# Patient Record
Sex: Male | Born: 1955 | Race: White | Hispanic: No | Marital: Married | Smoking: Former smoker
Health system: Southern US, Community
[De-identification: ages and names within clinical notes are randomized; demographics above are authoritative.]

## PROBLEM LIST (undated history)

## (undated) DIAGNOSIS — R5383 Other fatigue: Secondary | ICD-10-CM

## (undated) DIAGNOSIS — R202 Paresthesia of skin: Secondary | ICD-10-CM

## (undated) DIAGNOSIS — G709 Myoneural disorder, unspecified: Secondary | ICD-10-CM

## (undated) DIAGNOSIS — R7989 Other specified abnormal findings of blood chemistry: Secondary | ICD-10-CM

## (undated) DIAGNOSIS — R200 Anesthesia of skin: Secondary | ICD-10-CM

## (undated) DIAGNOSIS — E785 Hyperlipidemia, unspecified: Secondary | ICD-10-CM

## (undated) DIAGNOSIS — T7840XA Allergy, unspecified, initial encounter: Secondary | ICD-10-CM

## (undated) DIAGNOSIS — G43909 Migraine, unspecified, not intractable, without status migrainosus: Secondary | ICD-10-CM

## (undated) DIAGNOSIS — K219 Gastro-esophageal reflux disease without esophagitis: Secondary | ICD-10-CM

## (undated) DIAGNOSIS — K439 Ventral hernia without obstruction or gangrene: Secondary | ICD-10-CM

## (undated) DIAGNOSIS — K635 Polyp of colon: Secondary | ICD-10-CM

## (undated) DIAGNOSIS — M542 Cervicalgia: Secondary | ICD-10-CM

## (undated) DIAGNOSIS — M1990 Unspecified osteoarthritis, unspecified site: Secondary | ICD-10-CM

## (undated) DIAGNOSIS — M545 Low back pain, unspecified: Secondary | ICD-10-CM

## (undated) DIAGNOSIS — I10 Essential (primary) hypertension: Secondary | ICD-10-CM

## (undated) DIAGNOSIS — R945 Abnormal results of liver function studies: Secondary | ICD-10-CM

## (undated) DIAGNOSIS — M797 Fibromyalgia: Secondary | ICD-10-CM

## (undated) HISTORY — PX: NISSEN FUNDOPLICATION: SHX2091

## (undated) HISTORY — DX: Myoneural disorder, unspecified: G70.9

## (undated) HISTORY — DX: Fibromyalgia: M79.7

## (undated) HISTORY — DX: Essential (primary) hypertension: I10

## (undated) HISTORY — DX: Gastro-esophageal reflux disease without esophagitis: K21.9

## (undated) HISTORY — DX: Unspecified osteoarthritis, unspecified site: M19.90

## (undated) HISTORY — DX: Allergy, unspecified, initial encounter: T78.40XA

## (undated) HISTORY — DX: Cervicalgia: M54.2

## (undated) HISTORY — DX: Hyperlipidemia, unspecified: E78.5

## (undated) HISTORY — PX: UPPER GASTROINTESTINAL ENDOSCOPY: SHX188

## (undated) HISTORY — DX: Abnormal results of liver function studies: R94.5

## (undated) HISTORY — DX: Anesthesia of skin: R20.0

## (undated) HISTORY — PX: STOMACH SURGERY: SHX791

## (undated) HISTORY — DX: Low back pain, unspecified: M54.50

## (undated) HISTORY — DX: Paresthesia of skin: R20.2

## (undated) HISTORY — DX: Ventral hernia without obstruction or gangrene: K43.9

## (undated) HISTORY — PX: POLYPECTOMY: SHX149

## (undated) HISTORY — DX: Migraine, unspecified, not intractable, without status migrainosus: G43.909

## (undated) HISTORY — DX: Other specified abnormal findings of blood chemistry: R79.89

## (undated) HISTORY — PX: COLONOSCOPY: SHX174

## (undated) HISTORY — PX: HAND SURGERY: SHX662

## (undated) HISTORY — DX: Other fatigue: R53.83

## (undated) HISTORY — DX: Polyp of colon: K63.5

## (undated) HISTORY — DX: Low back pain: M54.5

---

## 1992-02-21 HISTORY — PX: HERNIA REPAIR: SHX51

## 2008-07-21 HISTORY — PX: NECK SURGERY: SHX720

## 2008-10-21 HISTORY — PX: OTHER SURGICAL HISTORY: SHX169

## 2012-11-27 ENCOUNTER — Ambulatory Visit (INDEPENDENT_AMBULATORY_CARE_PROVIDER_SITE_OTHER): Payer: BC Managed Care – PPO | Admitting: Family Medicine

## 2012-11-27 VITALS — BP 108/62 | Temp 98.2°F | Ht 65.0 in | Wt 162.1 lb

## 2012-11-27 DIAGNOSIS — G894 Chronic pain syndrome: Secondary | ICD-10-CM

## 2012-11-28 NOTE — Progress Notes (Signed)
Pt here to establish care. Specifically he would like pain medication prescribed.   History, in brief - Saw Dr. Sherryll Burger at [facility] since 03/2012 and was referred to Dr. Weldon Inches for PM. After having a positive UDS for ETOH, pain meds were not prescribed by Dr. Weldon Inches as of 10/2012. Pt upset that Dr. Sherryll Burger would not then start prescribing pain meds. Dr. Sherryll Burger referred to Dr. Gerilyn Pilgrim, who pt is to see this Friday. I spoke with Dr. Sherryll Burger and am told that the pt was angry that Dr. Sherryll Burger would not prescribe the pain meds so he requested his records and has transferred care here.   Pt has a h/o DDD, chronic pain discorder, fibromyalgia, cervicalgia, shoulder pain, leg pain, and a long history of pain management visits in [place].   Pt has most recently been on hydrocodone-apap 10's 4-6 times daily but he says he prefers dilaudid as it works better for him.   The pt and I had a frank discussion regarding his needs. I referred to our controlled substance policy for our office and explained that we do NOT manage chronic pain and will NOT prescribe narcotics or other controlled substances. If he is able to have his pain managed elsewhere, I am more than happy to contoinue to be his pcp and take care of his healthcare needs aside from the chronic pain. Pt very upset by this, raised his voice and became agitated. I was able to calm him down by reminding him that he does have an appt in 2 days with Dr. Gerilyn Pilgrim to discuss pain. The patient declined any PE today, and left, saying that he would see how Friday goes and then decide whether or not to continue seeing [place] for primary care.   Pt did receive flu shot.   No prescriptions given today.

## 2013-01-02 ENCOUNTER — Telehealth: Payer: Self-pay | Admitting: *Deleted

## 2013-01-02 DIAGNOSIS — I10 Essential (primary) hypertension: Secondary | ICD-10-CM

## 2013-01-02 MED ORDER — HYDROCHLOROTHIAZIDE 25 MG PO TABS: 25.0000 mg | ORAL_TABLET | Freq: Every day | ORAL | Status: DC

## 2013-01-02 NOTE — Telephone Encounter (Signed)
When pt and i spoke at our one and only meeting, he was unsure about continuing seeing [place] for primary care. He had come initially with the expectation that we would prescribe pain meds. If he is wanting [place] to be his pcp, that's fine, but he will need to come in for a visit to discuss his medical problems such as htn, will need lab work, and will need a cpe. I will send in 1 month of hctz now, giving him the benefit of the doubt. No further refills on anything until we have seen him however. Please let pt know. Thanks AW.

## 2013-01-02 NOTE — Addendum Note (Signed)
Addended by: Acey Lav on: 01/02/2013 01:26 PM   Modules accepted: Orders

## 2013-01-02 NOTE — Telephone Encounter (Signed)
Pt called and informed nurse that he needed a refill on HCTZ. Nurse noted after chart review that no medications were listed that he was taking, he stated that he was taking HCTZ 25 mg QD, Lisinopril 20 mg QD and Simvastatin but Dr. Judithann Sheen had taken him off Simvastatin. After entering medications he listed in chart I informed him that I would forward to MD for refill approval. He stated he only needed refill on HCTZ that he had plenty left of Lisinopril. Will route to MD.

## 2013-01-02 NOTE — Telephone Encounter (Signed)
Spoke with wife and she was notified. She stated that he would be calling soon for an appointment because he needed a CPE.

## 2013-02-03 ENCOUNTER — Telehealth: Payer: Self-pay | Admitting: *Deleted

## 2013-02-03 NOTE — Telephone Encounter (Signed)
Pt called and left VM rteturning nurse's call. Nurse returned pt call and informed him that when Rx was filled last month he was notified that he needed to come in to see MD before anymore refills would be given but we would supply him with that one month which we did. Pt stated that he does have a f/u scheduled in February. Informed pt that he was informed last month that MD requested him to be seen before any other refills were given. Pt stated "Well I will just keep my appointment in February and go without my blood pressure meds". Nurse informed him that we could give him an appointment sooner and would be able to handle his medication refills at that time but I could not give him any refills at this time but pt declined and stated "It's just gonna be on you all". Attempted to redirect pt and encourage for a sooner appointment but pt declined.

## 2013-02-03 NOTE — Telephone Encounter (Signed)
Pt called and requested refill on HCTZ for 90 day supply. After chart review noted that MD only wanted to refill x 1 until he is seen again for CPE. Nurse attempted to call pt, no answer, message left for callback.

## 2013-02-05 NOTE — Telephone Encounter (Signed)
Very appropriate. Thanks AW.

## 2013-04-08 ENCOUNTER — Encounter: Payer: BC Managed Care – PPO | Admitting: Family Medicine

## 2013-04-14 NOTE — Addendum Note (Signed)
Addended by: Agnes Lawrence on: 04/14/2013 11:50 AM   Modules accepted: Orders

## 2013-04-28 ENCOUNTER — Encounter: Payer: Self-pay | Admitting: Family Medicine

## 2013-04-28 ENCOUNTER — Ambulatory Visit (INDEPENDENT_AMBULATORY_CARE_PROVIDER_SITE_OTHER): Payer: BC Managed Care – PPO | Admitting: Family Medicine

## 2013-04-28 VITALS — BP 130/80 | HR 86 | Temp 98.0°F | Resp 20 | Ht 64.25 in | Wt 168.8 lb

## 2013-04-28 DIAGNOSIS — G894 Chronic pain syndrome: Secondary | ICD-10-CM

## 2013-04-28 NOTE — Progress Notes (Signed)
   Subjective:    Patient ID: Michael Branch, male    DOB: 28-Jul-1955, 58 y.o.   MRN: [id number]  HPI  Pt came to the office today for a CPE. He also c/o cough that has been present for 3 weeks. He takes albuterol and apap. No fevers, GI sx, or shob. He has a h/o smoking but quit 3 mos ago.   His primary concern today is that he feels his chronic pain is not being well managed. He sees Dr. Merlene Laughter for this and is prescribed Dilaudid 2mg  1-2 times daily. He would like an increase in this pain medication. He refuses any drug screens. When he was reminded that we do not manage chronic pain here (that is why he sees Dr. Merlene Laughter) he became beligerent, raised his voice, and was cursing. He was so aggressive that I was concerned for my safety and that of my officemates. I left the room and informed the patient that our visit was over. At the front desk he yelled at the receptionist and cursed at her as well. Pt was informed that he had 5 minutes to vacate the premesis or the police could escort him. He left.   Due to the above, the pt did not have a complete physical exam, although I did hear some mild wheezing on listening to his lungs. I was going to order him an albuterol treatment when he started the argument for narcotics. We did go through a series of questions regarding what blood tests, etc he was due for. As he will no longer be seen in our practice, He should seek a new pcp to handle his primary care.    Review of Systems wheezing, cough     Objective:   Physical Exam  As above      Assessment & Plan:  See above

## 2013-06-10 ENCOUNTER — Encounter (INDEPENDENT_AMBULATORY_CARE_PROVIDER_SITE_OTHER): Payer: Self-pay

## 2013-06-10 ENCOUNTER — Ambulatory Visit (INDEPENDENT_AMBULATORY_CARE_PROVIDER_SITE_OTHER): Payer: Medicare Other | Admitting: General Practice

## 2013-06-10 ENCOUNTER — Encounter: Payer: Self-pay | Admitting: General Practice

## 2013-06-10 VITALS — BP 112/78 | HR 94 | Temp 98.0°F | Ht 65.0 in | Wt 171.4 lb

## 2013-06-10 DIAGNOSIS — M542 Cervicalgia: Secondary | ICD-10-CM

## 2013-06-10 DIAGNOSIS — I10 Essential (primary) hypertension: Secondary | ICD-10-CM

## 2013-06-10 DIAGNOSIS — G8929 Other chronic pain: Secondary | ICD-10-CM

## 2013-06-10 DIAGNOSIS — G4700 Insomnia, unspecified: Secondary | ICD-10-CM

## 2013-06-10 DIAGNOSIS — E785 Hyperlipidemia, unspecified: Secondary | ICD-10-CM

## 2013-06-10 NOTE — Progress Notes (Signed)
   Subjective:    Patient ID: Michael Branch, male    DOB: [birth year], 58 y.o.   MRN: [id number]  HPI Patient presents today to establish care. He reports being seen by Dr.Shaw in [place], [place]. Reports history of hypertension, hyperlipidemia, insomnia, and chronic neck pain. He reports being treated previously by 2 pain management clinics. Was dismissed from one pain management clinic and discontinued going to 2nd because he didn't agree with treatment provided. He reports having a neck injury 12 years ago (currenlty wearing soft neck brace) and pain management began with providers up [place]. He has been in [place] area, for past 3 years. Patient reports pain is best managed with 4 hydrocodone tablets daily and 4mg  diluadid daily. He reports being without pain medications for past 3 months.     Review of Systems  Constitutional: Negative for fever and chills.  Respiratory: Negative for chest tightness and shortness of breath.   Cardiovascular: Negative for chest pain and palpitations.  Gastrointestinal: Negative for nausea, vomiting, abdominal pain, diarrhea, constipation and blood in stool.  Musculoskeletal:       Chronic neck pain   Neurological: Negative for dizziness, weakness and headaches.  Psychiatric/Behavioral: Positive for sleep disturbance. Negative for suicidal ideas and self-injury. The patient is not nervous/anxious.        Objective:   Physical Exam  Constitutional: He is oriented to person, place, and time. He appears well-developed and well-nourished.  HENT:  Head: Normocephalic and atraumatic.  Eyes:  Scarring to left eye (reports dog bite at 59 years old)  Neck: Neck supple. No thyromegaly present.  Cardiovascular: Normal rate, regular rhythm and normal heart sounds.   Pulmonary/Chest: Effort normal and breath sounds normal. No respiratory distress. He exhibits no tenderness.  Abdominal: Soft. Bowel sounds are normal. He exhibits no distension. There is no tenderness.    Musculoskeletal:  Soft cervical collar noted on patient's neck  Lymphadenopathy:    He has no cervical adenopathy.  Neurological: He is alert and oriented to person, place, and time.  Skin: Skin is warm and dry.  Psychiatric: He has a normal mood and affect.          Assessment & Plan:  1. Hypertension   2. Hyperlipidemia   3. Insomnia   4. Chronic neck pain -discussed with patient that pain management would need to be handled by a pain management clinic -patient declined referral to pain management, ortho specialist, or neurology -informed patient that other chronic and acute health conditions could be managed by this office -Patient verbalized understanding and in agreement with other chronic health conditions being managed at [facility] -discussed healthy eating -may seek emergency medical treatment -sign medical release form -RTO in 1 month for follow up labs drawn/meds refilled -Patient verbalized understanding Erby Pian, FNP-C

## 2013-06-11 ENCOUNTER — Other Ambulatory Visit: Payer: Self-pay | Admitting: General Practice

## 2013-06-11 ENCOUNTER — Telehealth: Payer: Self-pay | Admitting: General Practice

## 2013-06-11 DIAGNOSIS — I10 Essential (primary) hypertension: Secondary | ICD-10-CM

## 2013-06-11 DIAGNOSIS — G4700 Insomnia, unspecified: Secondary | ICD-10-CM

## 2013-06-11 MED ORDER — LISINOPRIL 20 MG PO TABS: 20.0000 mg | ORAL_TABLET | Freq: Every day | ORAL | Status: DC

## 2013-06-11 MED ORDER — AMITRIPTYLINE HCL 50 MG PO TABS: 50.0000 mg | ORAL_TABLET | Freq: Every day | ORAL | Status: DC

## 2013-06-11 MED ORDER — HYDROCHLOROTHIAZIDE 25 MG PO TABS: 25.0000 mg | ORAL_TABLET | Freq: Every day | ORAL | Status: DC

## 2013-06-11 NOTE — Telephone Encounter (Signed)
Please inform scripts sent to pharmacy. 30 day supply, patient needs follow up visit and labs before next refill.

## 2013-06-15 DIAGNOSIS — E785 Hyperlipidemia, unspecified: Secondary | ICD-10-CM | POA: Insufficient documentation

## 2013-06-15 DIAGNOSIS — G4700 Insomnia, unspecified: Secondary | ICD-10-CM | POA: Insufficient documentation

## 2013-06-15 DIAGNOSIS — G8929 Other chronic pain: Secondary | ICD-10-CM | POA: Insufficient documentation

## 2013-06-15 DIAGNOSIS — M542 Cervicalgia: Secondary | ICD-10-CM

## 2013-06-15 DIAGNOSIS — I10 Essential (primary) hypertension: Secondary | ICD-10-CM | POA: Insufficient documentation

## 2013-06-15 NOTE — Patient Instructions (Signed)

## 2013-06-26 ENCOUNTER — Telehealth: Payer: Self-pay | Admitting: General Practice

## 2013-06-26 NOTE — Telephone Encounter (Signed)
Patients number is no longer available/ disconnected

## 2013-06-27 ENCOUNTER — Telehealth: Payer: Self-pay | Admitting: General Practice

## 2013-06-27 NOTE — Telephone Encounter (Signed)
appt given for 6/8 with christy

## 2013-07-04 ENCOUNTER — Other Ambulatory Visit: Payer: Self-pay | Admitting: Family

## 2013-07-04 ENCOUNTER — Telehealth: Payer: Self-pay | Admitting: Family

## 2013-07-04 DIAGNOSIS — I10 Essential (primary) hypertension: Secondary | ICD-10-CM

## 2013-07-04 DIAGNOSIS — G4700 Insomnia, unspecified: Secondary | ICD-10-CM

## 2013-07-04 MED ORDER — LISINOPRIL 20 MG PO TABS: 20.0000 mg | ORAL_TABLET | Freq: Every day | ORAL | Status: DC

## 2013-07-04 MED ORDER — HYDROCHLOROTHIAZIDE 25 MG PO TABS: 25.0000 mg | ORAL_TABLET | Freq: Every day | ORAL | Status: DC

## 2013-07-04 MED ORDER — AMITRIPTYLINE HCL 50 MG PO TABS: 50.0000 mg | ORAL_TABLET | Freq: Every day | ORAL | Status: DC

## 2013-07-04 NOTE — Telephone Encounter (Signed)
HCTZ and Lisinopril refilled.  Will you refill Elavil until they can be seen?

## 2013-07-28 ENCOUNTER — Encounter: Payer: Self-pay | Admitting: Family

## 2013-07-28 ENCOUNTER — Ambulatory Visit (INDEPENDENT_AMBULATORY_CARE_PROVIDER_SITE_OTHER): Payer: Medicare Other | Admitting: Family

## 2013-07-28 VITALS — BP 116/77 | HR 109 | Temp 97.0°F | Ht 64.5 in | Wt 170.6 lb

## 2013-07-28 DIAGNOSIS — I10 Essential (primary) hypertension: Secondary | ICD-10-CM

## 2013-07-28 DIAGNOSIS — R5383 Other fatigue: Secondary | ICD-10-CM

## 2013-07-28 DIAGNOSIS — M545 Low back pain, unspecified: Secondary | ICD-10-CM

## 2013-07-28 DIAGNOSIS — M2550 Pain in unspecified joint: Secondary | ICD-10-CM

## 2013-07-28 DIAGNOSIS — R5381 Other malaise: Secondary | ICD-10-CM

## 2013-07-28 DIAGNOSIS — E785 Hyperlipidemia, unspecified: Secondary | ICD-10-CM

## 2013-07-28 DIAGNOSIS — G4700 Insomnia, unspecified: Secondary | ICD-10-CM

## 2013-07-28 DIAGNOSIS — Z1211 Encounter for screening for malignant neoplasm of colon: Secondary | ICD-10-CM

## 2013-07-28 MED ORDER — AMITRIPTYLINE HCL 50 MG PO TABS: 50.0000 mg | ORAL_TABLET | Freq: Every day | ORAL | Status: DC

## 2013-07-28 MED ORDER — HYDROCHLOROTHIAZIDE 25 MG PO TABS: 25.0000 mg | ORAL_TABLET | Freq: Every day | ORAL | Status: DC

## 2013-07-28 MED ORDER — LISINOPRIL 20 MG PO TABS: 20.0000 mg | ORAL_TABLET | Freq: Every day | ORAL | Status: DC

## 2013-07-28 MED ORDER — MELOXICAM 7.5 MG PO TABS: 7.5000 mg | ORAL_TABLET | Freq: Every day | ORAL | Status: DC

## 2013-07-28 NOTE — Progress Notes (Signed)
Subjective:    Patient ID: Michael Branch, male    DOB: [birth year], 58 y.o.   MRN: [id number]  Hyperlipidemia This is a chronic problem. The current episode started more than 1 year ago. The problem is uncontrolled. Recent lipid tests were reviewed and are high. He has no history of diabetes or hypothyroidism. Associated symptoms include myalgias. Pertinent negatives include no leg pain or shortness of breath. Current antihyperlipidemic treatment includes statins. The current treatment provides moderate improvement of lipids. Risk factors for coronary artery disease include dyslipidemia, family history, hypertension and male sex.  Hypertension This is a chronic problem. The current episode started more than 1 year ago. The problem has been resolved since onset. The problem is controlled. Pertinent negatives include no blurred vision, palpitations, peripheral edema or shortness of breath. Risk factors for coronary artery disease include dyslipidemia, family history and male gender. Past treatments include ACE inhibitors and diuretics. The current treatment provides moderate improvement. There is no history of kidney disease, CAD/MI or a thyroid problem.  Insomnia PT currently talking amitriptyline every night at bedtime. States he can not sleep without it. No complaints at this time.    Review of Systems  Constitutional: Positive for fatigue.  HENT: Negative.   Eyes: Negative for blurred vision.  Respiratory: Negative.  Negative for shortness of breath.   Cardiovascular: Negative for palpitations.  Genitourinary: Negative.   Musculoskeletal: Positive for myalgias.  Hematological: Negative.   Psychiatric/Behavioral: Negative.   All other systems reviewed and are negative.      Objective:   Physical Exam  Vitals reviewed. Constitutional: He is oriented to person, place, and time. He appears well-developed and well-nourished. No distress.  HENT:  Head: Normocephalic.  Right Ear:  External ear normal.  Left Ear: External ear normal.  Mouth/Throat: Oropharynx is clear and moist.  Eyes: Pupils are equal, round, and reactive to light. Right eye exhibits no discharge. Left eye exhibits no discharge.  Neck: Normal range of motion. Neck supple. No thyromegaly present.  Cardiovascular: Normal rate, regular rhythm, normal heart sounds and intact distal pulses.   No murmur heard. Pulmonary/Chest: Effort normal and breath sounds normal. No respiratory distress. He has no wheezes.  Abdominal: Soft. Bowel sounds are normal. He exhibits no distension. There is no tenderness.  Musculoskeletal: Normal range of motion. He exhibits no edema and no tenderness.  Neurological: He is alert and oriented to person, place, and time. He has normal reflexes. No cranial nerve deficit.  Skin: Skin is warm and dry. No rash noted. No erythema.  Psychiatric: He has a normal mood and affect. His behavior is normal. Judgment and thought content normal.      BP 116/77  Pulse 109  Temp(Src) 97 F (36.1 C) (Oral)  Ht 5' 4.5" (1.638 m)  Wt 170 lb 9.6 oz (77.384 kg)  BMI 28.84 kg/m2     Assessment & Plan:  1. Hypertension - CMP14+EGFR; Future  2. Hyperlipidemia - Lipid panel; Future  3. Insomnia - amitriptyline (ELAVIL) 50 MG tablet; Take 1 tablet (50 mg total) by mouth at bedtime. Take 1 to 2 tablets at bedtime  Dispense: 180 tablet; Refill: 3  4. Essential hypertension, benign - lisinopril (PRINIVIL,ZESTRIL) 20 MG tablet; Take 1 tablet (20 mg total) by mouth daily.  Dispense: 90 tablet; Refill: 3 - hydrochlorothiazide (HYDRODIURIL) 25 MG tablet; Take 1 tablet (25 mg total) by mouth daily.  Dispense: 90 tablet; Refill: 3  5. Other malaise and fatigue - Anemia Profile B; Future -  Thyroid Panel With TSH; Future - Vit D  25 hydroxy (rtn osteoporosis monitoring); Future  6. Colon cancer screening - Ambulatory referral to Gastroenterology  7. Lower back pain - meloxicam (MOBIC) 7.5 MG  tablet; Take 1 tablet (7.5 mg total) by mouth daily.  Dispense: 30 tablet; Refill: 2  8. Joint pain - meloxicam (MOBIC) 7.5 MG tablet; Take 1 tablet (7.5 mg total) by mouth daily.  Dispense: 30 tablet; Refill: 2   Continue all meds Labs pending Health Maintenance reviewed Diet and exercise encouraged RTO 6 months  Evelina Dun, FNP

## 2013-07-28 NOTE — Patient Instructions (Signed)

## 2013-07-29 ENCOUNTER — Other Ambulatory Visit (INDEPENDENT_AMBULATORY_CARE_PROVIDER_SITE_OTHER): Payer: Medicare Other

## 2013-07-29 DIAGNOSIS — W57XXXA Bitten or stung by nonvenomous insect and other nonvenomous arthropods, initial encounter: Secondary | ICD-10-CM

## 2013-07-29 DIAGNOSIS — E785 Hyperlipidemia, unspecified: Secondary | ICD-10-CM

## 2013-07-29 DIAGNOSIS — R5381 Other malaise: Secondary | ICD-10-CM

## 2013-07-29 DIAGNOSIS — R5383 Other fatigue: Principal | ICD-10-CM

## 2013-07-29 DIAGNOSIS — I10 Essential (primary) hypertension: Secondary | ICD-10-CM

## 2013-07-31 ENCOUNTER — Other Ambulatory Visit: Payer: Self-pay | Admitting: Family

## 2013-07-31 ENCOUNTER — Telehealth: Payer: Self-pay | Admitting: Family Medicine

## 2013-07-31 LAB — LYME AB/WESTERN BLOT REFLEX
LYME DISEASE AB, QUANT, IGM: <0.8 index (ref 0.00–0.79)
Lyme IgG/IgM Ab: <0.91 {ISR} (ref 0.00–0.90)

## 2013-07-31 LAB — ANEMIA PROFILE B
BASOS: 2 %
Basophils Absolute: 0.1 10*3/uL (ref 0.0–0.2)
Eos: 5 %
Eosinophils Absolute: 0.3 10*3/uL (ref 0.0–0.4)
FERRITIN: 429 ng/mL — AB (ref 30–400)
Folate: >19.9 ng/mL (ref >3.0)
HCT: 40.4 % (ref 37.5–51.0)
HEMOGLOBIN: 14.1 g/dL (ref 12.6–17.7)
IRON: 122 ug/dL (ref 40–155)
Immature Grans (Abs): 0 10*3/uL (ref 0.0–0.1)
Immature Granulocytes: 0 %
Iron Saturation: 37 % (ref 15–55)
LYMPHS ABS: 2 10*3/uL (ref 0.7–3.1)
Lymphs: 36 %
MCH: 31.1 pg (ref 26.6–33.0)
MCHC: 34.9 g/dL (ref 31.5–35.7)
MCV: 89 fL (ref 79–97)
MONOS ABS: 0.6 10*3/uL (ref 0.1–0.9)
Monocytes: 11 %
Neutrophils Absolute: 2.6 10*3/uL (ref 1.4–7.0)
Neutrophils Relative %: 46 %
PLATELETS: 271 10*3/uL (ref 150–379)
RBC: 4.53 x10E6/uL (ref 4.14–5.80)
RDW: 14 % (ref 12.3–15.4)
Retic Ct Pct: 1.2 % (ref 0.6–2.6)
TIBC: 328 ug/dL (ref 250–450)
UIBC: 206 ug/dL (ref 150–375)
Vitamin B-12: 524 pg/mL (ref 211–946)
WBC: 5.5 10*3/uL (ref 3.4–10.8)

## 2013-07-31 LAB — CMP14+EGFR
ALBUMIN: 4.8 g/dL (ref 3.5–5.5)
ALK PHOS: 83 IU/L (ref 39–117)
ALT: 107 IU/L — ABNORMAL HIGH (ref 0–44)
AST: 44 IU/L — ABNORMAL HIGH (ref 0–40)
Albumin/Globulin Ratio: 2.3 (ref 1.1–2.5)
BILIRUBIN TOTAL: 0.7 mg/dL (ref 0.0–1.2)
BUN / CREAT RATIO: 14 (ref 9–20)
BUN: 15 mg/dL (ref 6–24)
CO2: 24 mmol/L (ref 18–29)
CREATININE: 1.07 mg/dL (ref 0.76–1.27)
Calcium: 9.8 mg/dL (ref 8.7–10.2)
Chloride: 99 mmol/L (ref 97–108)
GFR calc non Af Amer: 76 mL/min/{1.73_m2} (ref >59)
GFR, EST AFRICAN AMERICAN: 88 mL/min/{1.73_m2} (ref >59)
GLOBULIN, TOTAL: 2.1 g/dL (ref 1.5–4.5)
Glucose: 93 mg/dL (ref 65–99)
Potassium: 5 mmol/L (ref 3.5–5.2)
Sodium: 139 mmol/L (ref 134–144)
Total Protein: 6.9 g/dL (ref 6.0–8.5)

## 2013-07-31 LAB — THYROID PANEL WITH TSH
Free Thyroxine Index: 1.8 (ref 1.2–4.9)
T3 Uptake Ratio: 30 % (ref 24–39)
T4, Total: 6 ug/dL (ref 4.5–12.0)
TSH: 1.84 u[IU]/mL (ref 0.450–4.500)

## 2013-07-31 LAB — LIPID PANEL
Chol/HDL Ratio: 3.2 ratio units (ref 0.0–5.0)
Cholesterol, Total: 214 mg/dL — ABNORMAL HIGH (ref 100–199)
HDL: 66 mg/dL (ref >39)
LDL CALC: 105 mg/dL — AB (ref 0–99)
Triglycerides: 213 mg/dL — ABNORMAL HIGH (ref 0–149)
VLDL Cholesterol Cal: 43 mg/dL — ABNORMAL HIGH (ref 5–40)

## 2013-07-31 LAB — ROCKY MTN SPOTTED FVR ABS PNL(IGG+IGM)
RMSF IGG: NEGATIVE
RMSF IGM: 0.46 {index} (ref 0.00–0.89)

## 2013-07-31 LAB — VITAMIN D 25 HYDROXY (VIT D DEFICIENCY, FRACTURES): Vit D, 25-Hydroxy: 31.4 ng/mL (ref 30.0–100.0)

## 2013-07-31 MED ORDER — ATORVASTATIN CALCIUM 40 MG PO TABS: 40.0000 mg | ORAL_TABLET | Freq: Every day | ORAL | Status: DC

## 2013-07-31 NOTE — Telephone Encounter (Signed)
Message copied by Waverly Ferrari on Thu Jul 31, 2013  3:18 PM ------      Message from: Lenna Gilford, [place] A      Created: Thu Jul 31, 2013 11:16 AM       Anemia Profile (Wbc, Plts, Hgb, Iron, and Vit B12)-WNL      Kidney function stable- Liver levels high- Need to recheck- May need to adjust medication accordingly-Do we have labs from last place he was being seen as a pt?      Cholesterol levels are high-New rx of Liptor 40mg  sent to pharmacy-Stop zocor      Thyroid levels WNL      Vit D levels are on low side of normal-Need to start on Vit D OTC      [place] and [place] Spotted Fever-Negative ------

## 2013-08-05 NOTE — Progress Notes (Signed)
Pt aware of needing to redraw labs next mos. Appt has been made

## 2013-08-19 ENCOUNTER — Encounter: Payer: Self-pay | Admitting: Internal Medicine

## 2013-09-05 ENCOUNTER — Telehealth: Payer: Self-pay | Admitting: Family

## 2013-09-05 ENCOUNTER — Telehealth: Payer: Self-pay | Admitting: Nurse Practitioner

## 2013-09-05 ENCOUNTER — Ambulatory Visit (INDEPENDENT_AMBULATORY_CARE_PROVIDER_SITE_OTHER): Payer: Medicare Other | Admitting: Family

## 2013-09-05 ENCOUNTER — Encounter: Payer: Self-pay | Admitting: Family

## 2013-09-05 VITALS — BP 109/74 | HR 94 | Temp 97.1°F | Ht 64.5 in | Wt 168.8 lb

## 2013-09-05 DIAGNOSIS — R748 Abnormal levels of other serum enzymes: Secondary | ICD-10-CM

## 2013-09-05 DIAGNOSIS — G8929 Other chronic pain: Secondary | ICD-10-CM

## 2013-09-05 DIAGNOSIS — M542 Cervicalgia: Secondary | ICD-10-CM

## 2013-09-05 MED ORDER — TRAMADOL HCL 50 MG PO TABS: 50.0000 mg | ORAL_TABLET | Freq: Three times a day (TID) | ORAL | Status: DC | PRN

## 2013-09-05 MED ORDER — TAPENTADOL HCL 50 MG PO TABS: 50.0000 mg | ORAL_TABLET | Freq: Four times a day (QID) | ORAL | Status: DC | PRN

## 2013-09-05 NOTE — Telephone Encounter (Signed)
Patient gave wrong provider name

## 2013-09-05 NOTE — Progress Notes (Signed)
   Subjective:    Patient ID: Michael Branch, male    DOB: 03-Jun-1955, 58 y.o.   MRN: [id number]  HPI Pt presents to the office to recheck liver enzymes. Pt states he never started his cholesterol medications. Pt states he has been taking 8-10 tylenols a day for the last couple of months since been taking off "pain medication". Pt  States over the last 9 months he drinks 4 beers a night. Pt states he does not have a high fat diet.    Review of Systems  Constitutional: Negative.   HENT: Negative.   Respiratory: Negative.   Cardiovascular: Negative.   Gastrointestinal: Negative.   Endocrine: Negative.   Genitourinary: Negative.   Musculoskeletal: Positive for arthralgias, back pain, joint swelling and neck pain.  Neurological: Negative.   Hematological: Negative.   Psychiatric/Behavioral: Negative.   All other systems reviewed and are negative.      Objective:   Physical Exam  Vitals reviewed. Constitutional: He is oriented to person, place, and time. He appears well-developed and well-nourished. No distress.  Cardiovascular: Normal rate, regular rhythm, normal heart sounds and intact distal pulses.   No murmur heard. Pulmonary/Chest: Effort normal and breath sounds normal. No respiratory distress. He has no wheezes.  Abdominal: Soft. Bowel sounds are normal. He exhibits no distension. There is no tenderness.  Musculoskeletal: Normal range of motion. He exhibits no edema and no tenderness.  Neurological: He is alert and oriented to person, place, and time. He has normal reflexes. No cranial nerve deficit.  Skin: Skin is warm and dry. No rash noted. No erythema.  Psychiatric: He has a normal mood and affect. His behavior is normal. Judgment and thought content normal.    BP 109/74  Pulse 94  Temp(Src) 97.1 F (36.2 C) (Oral)  Ht 5' 4.5" (1.638 m)  Wt 168 lb 12.8 oz (76.567 kg)  BMI 28.54 kg/m2       Assessment & Plan:  1. Elevated liver enzymes -If enzymes come back  elevated will refer to GI dr- Pt would like to wait for referral until labs are redrawn -Pt stop all tylenol and alcohol intake  2. Chronic neck pain - tapentadol (NUCYNTA) 50 MG TABS tablet; Take 1 tablet (50 mg total) by mouth every 6 (six) hours as needed.  Dispense: 60 tablet; Refill: 0   Evelina Dun, FNP

## 2013-09-05 NOTE — Patient Instructions (Signed)
Liver Disease Diet The liver disease diet offers guidelines regarding what foods to eat while affected by a liver disease. The liver is the largest organ in the body and is involved in many important bodily functions. Some of the liver's functions are to remove harmful substances from the bloodstream and to make sure they can exit the body. The liver also produces fluids used by the body and helps use and store energy from food. Liver disease affects the functioning of the liver and the way your body uses energy from foods. An important part of the liver disease diet is to get the right amount of calories from a variety of foods. GUIDELINES Sodium Sodium is a mineral that helps the body change the amount of water and fluids it holds. Too much sodium can cause the body to hold too much fluid. You may need to decrease the amount of sodium in your diet if your body is collecting fluid in your stomach or legs. To decrease the amount of sodium in your diet, do not add extra salt to foods. Also, limit or avoid foods that contain lots of sodium, such as:  Salted snacks (pretzels, potato chips, crackers).  Canned foods (vegetables, soups, juice).  Salted or cured meats and deli meats.  Condiments (ketchup, mustard, soy sauce, barbecue sauce).  Sauerkraut and pickles.  Frozen dinners or processed or preserved foods. A diet of less than 2000 mg of sodium may be recommended. Check food labels for specific levels of sodium.  Alcohol Drinking alcohol may harm your liver. Avoid or limit drinks containing alcohol, such as beer, wine, and hard liquor.  Calories It is important to make sure you are getting enough calories in your diet so that your body gets enough energy and stays at a healthy weight. Include a variety of foods in your diet. Carbohydrates Carbohydrates are found in foods such as breads and starches, grains (oats, flour), cereals, and some vegetables (corn, peas). Carbohydrates change the level  of sugar (glucose) in the blood. Advanced liver disease can affect how much glucose is in the blood, making the glucose level too high or too low. Eating carbohydrates in the right amount will help control your blood glucose. A registered dietician can help you determine how much carbohydrate you need each day. Protein Eating the right amount of protein every day is important for liver disease. Protein is found in foods such as meat, poultry (chicken, [place]), fish, milk, eggs, yogurt, peanuts, peanut butter, and beans. Include a protein-containing food at each meal. Document Released: 02/06/2005 Document Revised: 05/01/2011 Document Reviewed: 12/29/2010 [facility] Patient Information 2015 [place], [place]. This information is not intended to replace advice given to you by your health care provider. Make sure you discuss any questions you have with your health care provider.

## 2013-09-06 LAB — CMP14+EGFR
ALT: 80 IU/L — ABNORMAL HIGH (ref 0–44)
AST: 36 IU/L (ref 0–40)
Albumin/Globulin Ratio: 2.3 (ref 1.1–2.5)
Albumin: 5 g/dL (ref 3.5–5.5)
Alkaline Phosphatase: 81 IU/L (ref 39–117)
BUN/Creatinine Ratio: 14 (ref 9–20)
BUN: 16 mg/dL (ref 6–24)
CALCIUM: 10.2 mg/dL (ref 8.7–10.2)
CO2: 25 mmol/L (ref 18–29)
CREATININE: 1.17 mg/dL (ref 0.76–1.27)
Chloride: 95 mmol/L — ABNORMAL LOW (ref 97–108)
GFR calc Af Amer: 79 mL/min/{1.73_m2} (ref >59)
GFR calc non Af Amer: 68 mL/min/{1.73_m2} (ref >59)
GLOBULIN, TOTAL: 2.2 g/dL (ref 1.5–4.5)
GLUCOSE: 98 mg/dL (ref 65–99)
Potassium: 5.1 mmol/L (ref 3.5–5.2)
SODIUM: 139 mmol/L (ref 134–144)
TOTAL PROTEIN: 7.2 g/dL (ref 6.0–8.5)
Total Bilirubin: 0.6 mg/dL (ref 0.0–1.2)

## 2013-09-10 ENCOUNTER — Telehealth: Payer: Self-pay | Admitting: Family

## 2013-09-10 NOTE — Telephone Encounter (Signed)
Gave to [employer] on 09/10/13

## 2013-10-13 ENCOUNTER — Ambulatory Visit (INDEPENDENT_AMBULATORY_CARE_PROVIDER_SITE_OTHER): Payer: Medicare Other | Admitting: Family

## 2013-10-13 ENCOUNTER — Encounter: Payer: Self-pay | Admitting: Family

## 2013-10-13 VITALS — BP 115/81 | HR 98 | Temp 97.9°F | Ht 64.5 in | Wt 168.2 lb

## 2013-10-13 DIAGNOSIS — Z125 Encounter for screening for malignant neoplasm of prostate: Secondary | ICD-10-CM

## 2013-10-13 DIAGNOSIS — M542 Cervicalgia: Secondary | ICD-10-CM

## 2013-10-13 DIAGNOSIS — G8929 Other chronic pain: Secondary | ICD-10-CM

## 2013-10-13 DIAGNOSIS — G4700 Insomnia, unspecified: Secondary | ICD-10-CM

## 2013-10-13 MED ORDER — TEMAZEPAM 30 MG PO CAPS: 30.0000 mg | ORAL_CAPSULE | Freq: Every evening | ORAL | Status: DC | PRN

## 2013-10-13 MED ORDER — TAPENTADOL HCL 50 MG PO TABS: 50.0000 mg | ORAL_TABLET | Freq: Four times a day (QID) | ORAL | Status: DC | PRN

## 2013-10-13 NOTE — Progress Notes (Signed)
   Subjective:    Patient ID: Michael Branch, male    DOB: 14-Feb-1956, 58 y.o.   MRN: [id number]  HPI Pt presents to the office for follow-up for elevated enzymes. Pt was seen on 07/29/13 with with elevated enzymes and had it redrawn on 09/05/13 with a decrease in his enzymes. Pt states he has eliminated tylenol  and decreased his salt and alcohol  intake.   PT states  He is having muscle pain in his arm, jaws, and legs. Pt states this arm numbness some times wakes him up at night. Pt states that the jaw pain started 2 months. Pt states he is having a hard time sleeping at night even with the amitriptyline.    Review of Systems  Constitutional: Negative.   HENT: Negative.   Respiratory: Negative.   Cardiovascular: Negative.   Gastrointestinal: Negative.   Endocrine: Negative.   Genitourinary: Negative.   Musculoskeletal: Negative.   Neurological: Negative.   Hematological: Negative.   Psychiatric/Behavioral: Negative.   All other systems reviewed and are negative.      Objective:   Physical Exam  Vitals reviewed. Constitutional: He is oriented to person, place, and time. He appears well-developed and well-nourished. No distress.  HENT:  Head: Normocephalic.  Right Ear: External ear normal.  Left Ear: External ear normal.  Nose: Nose normal.  Mouth/Throat: Oropharynx is clear and moist.  Eyes: Pupils are equal, round, and reactive to light. Right eye exhibits no discharge. Left eye exhibits no discharge.  Neck: Normal range of motion. Neck supple. No thyromegaly present.  Cardiovascular: Normal rate, regular rhythm, normal heart sounds and intact distal pulses.   No murmur heard. Pulmonary/Chest: Effort normal and breath sounds normal. No respiratory distress. He has no wheezes.  Abdominal: Soft. Bowel sounds are normal. He exhibits no distension. There is no tenderness.  Musculoskeletal: Normal range of motion. He exhibits no edema and no tenderness.  Neurological: He is  alert and oriented to person, place, and time. He has normal reflexes. No cranial nerve deficit.  Skin: Skin is warm and dry. No rash noted. No erythema.  Psychiatric: He has a normal mood and affect. His behavior is normal. Judgment and thought content normal.   BP 115/81  Pulse 98  Temp(Src) 97.9 F (36.6 C) (Oral)  Ht 5' 4.5" (1.638 m)  Wt 168 lb 3.2 oz (76.295 kg)  BMI 28.44 kg/m2        Assessment & Plan:  1. Insomnia - temazepam (RESTORIL) 30 MG capsule; Take 1 capsule (30 mg total) by mouth at bedtime as needed for sleep.  Dispense: 30 capsule; Refill: 0  2. Chronic neck pain -Spent 41mns or greater discussing pain management options    CMP14+EGFR - tapentadol (NUCYNTA) 50 MG TABS tablet; Take 1 tablet (50 mg total) by mouth every 6 (six) hours as needed.  Dispense: 90 tablet; Refill: 0  3. Prostate cancer screening - PSA, total and free   Continue all meds Labs pending Diet and exercise encouraged RTO 3 months  CEvelina Dun FNP

## 2013-10-13 NOTE — Patient Instructions (Signed)
Chronic Pain Chronic pain can be defined as pain that is off and on and lasts for 3-6 months or longer. Many things cause chronic pain, which can make it difficult to make a diagnosis. There are many treatment options available for chronic pain. However, finding a treatment that works well for you may require trying various approaches until the right one is found. Many people benefit from a combination of two or more types of treatment to control their pain. SYMPTOMS  Chronic pain can occur anywhere in the body and can range from mild to very severe. Some types of chronic pain include:  Headache.  Low back pain.  Cancer pain.  Arthritis pain.  Neurogenic pain. This is pain resulting from damage to nerves. People with chronic pain may also have other symptoms such as:  Depression.  Anger.  Insomnia.  Anxiety. DIAGNOSIS  Your health care provider will help diagnose your condition over time. In many cases, the initial focus will be on excluding possible conditions that could be causing the pain. Depending on your symptoms, your health care provider may order tests to diagnose your condition. Some of these tests may include:   Blood tests.   CT scan.   MRI.   X-rays.   Ultrasounds.   Nerve conduction studies.  You may need to see a specialist.  TREATMENT  Finding treatment that works well may take time. You may be referred to a pain specialist. He or she may prescribe medicine or therapies, such as:   Mindful meditation or yoga.  Shots (injections) of numbing or pain-relieving medicines into the spine or area of pain.  Local electrical stimulation.  Acupuncture.   Massage therapy.   Aroma, color, light, or sound therapy.   Biofeedback.   Working with a physical therapist to keep from getting stiff.   Regular, gentle exercise.   Cognitive or behavioral therapy.   Group support.  Sometimes, surgery may be recommended.  HOME CARE INSTRUCTIONS    Take all medicines as directed by your health care provider.   Lessen stress in your life by relaxing and doing things such as listening to calming music.   Exercise or be active as directed by your health care provider.   Eat a healthy diet and include things such as vegetables, fruits, fish, and lean meats in your diet.   Keep all follow-up appointments with your health care provider.   Attend a support group with others suffering from chronic pain. SEEK MEDICAL CARE IF:   Your pain gets worse.   You develop a new pain that was not there before.   You cannot tolerate medicines given to you by your health care provider.   You have new symptoms since your last visit with your health care provider.  SEEK IMMEDIATE MEDICAL CARE IF:   You feel weak.   You have decreased sensation or numbness.   You lose control of bowel or bladder function.   Your pain suddenly gets much worse.   You develop shaking.  You develop chills.  You develop confusion.  You develop chest pain.  You develop shortness of breath.  MAKE SURE YOU:  Understand these instructions.  Will watch your condition.  Will get help right away if you are not doing well or get worse. Document Released: 10/29/2001 Document Revised: 10/09/2012 Document Reviewed: 08/02/2012 [facility] Patient Information 2015 [place], [place]. This information is not intended to replace advice given to you by your health care provider. Make sure you discuss any  questions you have with your health care provider. Insomnia Insomnia is frequent trouble falling and/or staying asleep. Insomnia can be a long term problem or a short term problem. Both are common. Insomnia can be a short term problem when the wakefulness is related to a certain stress or worry. Long term insomnia is often related to ongoing stress during waking hours and/or poor sleeping habits. Overtime, sleep deprivation itself can make the problem worse.  Every little thing feels more severe because you are overtired and your ability to cope is decreased. CAUSES   Stress, anxiety, and depression.  Poor sleeping habits.  Distractions such as TV in the bedroom.  Naps close to bedtime.  Engaging in emotionally charged conversations before bed.  Technical reading before sleep.  Alcohol and other sedatives. They may make the problem worse. They can hurt normal sleep patterns and normal dream activity.  Stimulants such as caffeine for several hours prior to bedtime.  Pain syndromes and shortness of breath can cause insomnia.  Exercise late at night.  Changing time zones may cause sleeping problems (jet lag). It is sometimes helpful to have someone observe your sleeping patterns. They should look for periods of not breathing during the night (sleep apnea). They should also look to see how long those periods last. If you live alone or observers are uncertain, you can also be observed at a sleep clinic where your sleep patterns will be professionally monitored. Sleep apnea requires a checkup and treatment. Give your caregivers your medical history. Give your caregivers observations your family has made about your sleep.  SYMPTOMS   Not feeling rested in the morning.  Anxiety and restlessness at bedtime.  Difficulty falling and staying asleep. TREATMENT   Your caregiver may prescribe treatment for an underlying medical disorders. Your caregiver can give advice or help if you are using alcohol or other drugs for self-medication. Treatment of underlying problems will usually eliminate insomnia problems.  Medications can be prescribed for short time use. They are generally not recommended for lengthy use.  Over-the-counter sleep medicines are not recommended for lengthy use. They can be habit forming.  You can promote easier sleeping by making lifestyle changes such as:  Using relaxation techniques that help with breathing and reduce  muscle tension.  Exercising earlier in the day.  Changing your diet and the time of your last meal. No night time snacks.  Establish a regular time to go to bed.  Counseling can help with stressful problems and worry.  Soothing music and white noise may be helpful if there are background noises you cannot remove.  Stop tedious detailed work at least one hour before bedtime. HOME CARE INSTRUCTIONS   Keep a diary. Inform your caregiver about your progress. This includes any medication side effects. See your caregiver regularly. Take note of:  Times when you are asleep.  Times when you are awake during the night.  The quality of your sleep.  How you feel the next day. This information will help your caregiver care for you.  Get out of bed if you are still awake after 15 minutes. Read or do some quiet activity. Keep the lights down. Wait until you feel sleepy and go back to bed.  Keep regular sleeping and waking hours. Avoid naps.  Exercise regularly.  Avoid distractions at bedtime. Distractions include watching television or engaging in any intense or detailed activity like attempting to balance the household checkbook.  Develop a bedtime ritual. Keep a familiar routine of bathing,  brushing your teeth, climbing into bed at the same time each night, listening to soothing music. Routines increase the success of falling to sleep faster.  Use relaxation techniques. This can be using breathing and muscle tension release routines. It can also include visualizing peaceful scenes. You can also help control troubling or intruding thoughts by keeping your mind occupied with boring or repetitive thoughts like the old concept of counting sheep. You can make it more creative like imagining planting one beautiful flower after another in your backyard garden.  During your day, work to eliminate stress. When this is not possible use some of the previous suggestions to help reduce the anxiety that  accompanies stressful situations. MAKE SURE YOU:   Understand these instructions.  Will watch your condition.  Will get help right away if you are not doing well or get worse. Document Released: 02/04/2000 Document Revised: 05/01/2011 Document Reviewed: 03/06/2007 [facility] Patient Information 2015 [place], [place]. This information is not intended to replace advice given to you by your health care provider. Make sure you discuss any questions you have with your health care provider.

## 2013-10-14 LAB — PSA, TOTAL AND FREE
PSA FREE PCT: 8.3 %
PSA, Free: 0.15 ng/mL
PSA: 1.8 ng/mL (ref 0.0–4.0)

## 2013-10-14 LAB — CMP14+EGFR
ALT: 62 IU/L — AB (ref 0–44)
AST: 29 IU/L (ref 0–40)
Albumin/Globulin Ratio: 2.6 — ABNORMAL HIGH (ref 1.1–2.5)
Albumin: 5 g/dL (ref 3.5–5.5)
Alkaline Phosphatase: 78 IU/L (ref 39–117)
BILIRUBIN TOTAL: 0.5 mg/dL (ref 0.0–1.2)
BUN/Creatinine Ratio: 13 (ref 9–20)
BUN: 16 mg/dL (ref 6–24)
CO2: 23 mmol/L (ref 18–29)
Calcium: 9.9 mg/dL (ref 8.7–10.2)
Chloride: 97 mmol/L (ref 97–108)
Creatinine, Ser: 1.28 mg/dL — ABNORMAL HIGH (ref 0.76–1.27)
GFR calc Af Amer: 71 mL/min/{1.73_m2} (ref >59)
GFR calc non Af Amer: 61 mL/min/{1.73_m2} (ref >59)
Globulin, Total: 1.9 g/dL (ref 1.5–4.5)
Glucose: 102 mg/dL — ABNORMAL HIGH (ref 65–99)
Potassium: 5.1 mmol/L (ref 3.5–5.2)
Sodium: 137 mmol/L (ref 134–144)
TOTAL PROTEIN: 6.9 g/dL (ref 6.0–8.5)

## 2013-10-15 ENCOUNTER — Encounter: Payer: Self-pay | Admitting: Internal Medicine

## 2013-10-16 ENCOUNTER — Telehealth: Payer: Self-pay | Admitting: Family

## 2013-10-16 ENCOUNTER — Telehealth: Payer: Self-pay | Admitting: *Deleted

## 2013-10-16 NOTE — Telephone Encounter (Signed)
Message copied by Cline Crock on Thu Oct 16, 2013  3:22 PM ------      Message from: [place], [place] A      Created: Wed Oct 15, 2013  2:32 PM       Kidney and liver function stable- Liver enzymes continue to decrease       ------

## 2013-10-16 NOTE — Telephone Encounter (Signed)
Discussed results with patient

## 2013-10-16 NOTE — Telephone Encounter (Signed)
Patient has noticed persistent nausea since discontinuing Elavil.  He is taking Zantac 150mg  daily and Tums PRN. This as well as eating helps short term. He questioned whether we thought this would improve over the next few days or if there is something that can be done. He also asked it he could take Zantac more than once a day. Advised that he can take 150mg  BID if necessary.

## 2013-10-20 ENCOUNTER — Other Ambulatory Visit: Payer: Self-pay | Admitting: Family Medicine

## 2013-10-20 MED ORDER — ONDANSETRON HCL 4 MG PO TABS: 4.0000 mg | ORAL_TABLET | Freq: Three times a day (TID) | ORAL | Status: DC | PRN

## 2013-10-20 NOTE — Telephone Encounter (Signed)
rx called in

## 2013-10-20 NOTE — Telephone Encounter (Signed)
Zofran sent to pharmacy for nausea. Pt can take 1-2 tablets every 8 hours until nausea improves. The nausea should only be temporary.

## 2013-10-21 ENCOUNTER — Telehealth: Payer: Self-pay | Admitting: Family

## 2013-10-22 ENCOUNTER — Ambulatory Visit: Payer: Self-pay | Admitting: Family

## 2013-10-23 MED ORDER — AMITRIPTYLINE HCL 50 MG PO TABS: 50.0000 mg | ORAL_TABLET | Freq: Every day | ORAL | Status: DC

## 2013-10-23 NOTE — Telephone Encounter (Signed)
RX sent to pharmacy  

## 2013-11-06 ENCOUNTER — Telehealth: Payer: Self-pay | Admitting: Family

## 2013-11-06 DIAGNOSIS — M542 Cervicalgia: Principal | ICD-10-CM

## 2013-11-06 DIAGNOSIS — G8929 Other chronic pain: Secondary | ICD-10-CM

## 2013-11-07 MED ORDER — AMITRIPTYLINE HCL 50 MG PO TABS: 50.0000 mg | ORAL_TABLET | Freq: Every day | ORAL | Status: DC

## 2013-11-07 MED ORDER — TAPENTADOL HCL 50 MG PO TABS: 50.0000 mg | ORAL_TABLET | Freq: Four times a day (QID) | ORAL | Status: DC | PRN

## 2013-11-07 NOTE — Telephone Encounter (Signed)
Left Elavil refill on pharmacy voicemail. Patient aware to pickup Nucynta.

## 2013-11-11 ENCOUNTER — Ambulatory Visit (INDEPENDENT_AMBULATORY_CARE_PROVIDER_SITE_OTHER): Payer: Medicare Other

## 2013-11-11 ENCOUNTER — Encounter: Payer: Self-pay | Admitting: Family Medicine

## 2013-11-11 ENCOUNTER — Ambulatory Visit (INDEPENDENT_AMBULATORY_CARE_PROVIDER_SITE_OTHER): Payer: Medicare Other | Admitting: Family Medicine

## 2013-11-11 VITALS — BP 116/71 | HR 97 | Temp 97.5°F | Ht 64.5 in | Wt 167.0 lb

## 2013-11-11 DIAGNOSIS — R059 Cough, unspecified: Secondary | ICD-10-CM

## 2013-11-11 DIAGNOSIS — J209 Acute bronchitis, unspecified: Secondary | ICD-10-CM

## 2013-11-11 DIAGNOSIS — R05 Cough: Secondary | ICD-10-CM

## 2013-11-11 DIAGNOSIS — J208 Acute bronchitis due to other specified organisms: Secondary | ICD-10-CM

## 2013-11-11 MED ORDER — AMLODIPINE BESYLATE 5 MG PO TABS: 5.0000 mg | ORAL_TABLET | Freq: Every day | ORAL | Status: DC

## 2013-11-11 MED ORDER — AMOXICILLIN 875 MG PO TABS: 875.0000 mg | ORAL_TABLET | Freq: Two times a day (BID) | ORAL | Status: DC

## 2013-11-11 MED ORDER — LEVALBUTEROL HCL 1.25 MG/0.5ML IN NEBU: 1.2500 mg | INHALATION_SOLUTION | Freq: Once | RESPIRATORY_TRACT | Status: DC

## 2013-11-11 MED ORDER — ALBUTEROL SULFATE HFA 108 (90 BASE) MCG/ACT IN AERS: 2.0000 | INHALATION_SPRAY | Freq: Four times a day (QID) | RESPIRATORY_TRACT | Status: DC | PRN

## 2013-11-11 MED ORDER — LEVALBUTEROL HCL 1.25 MG/3ML IN NEBU
1.2500 mg | INHALATION_SOLUTION | Freq: Once | RESPIRATORY_TRACT | Status: AC
  Administered 2013-11-11: 1.25 mg via RESPIRATORY_TRACT

## 2013-11-11 MED ORDER — METHYLPREDNISOLONE ACETATE 80 MG/ML IJ SUSP
120.0000 mg | Freq: Once | INTRAMUSCULAR | Status: AC
  Administered 2013-11-11: 120 mg via INTRAMUSCULAR

## 2013-11-11 MED ORDER — HYDROCODONE-HOMATROPINE 5-1.5 MG/5ML PO SYRP: 5.0000 mL | ORAL_SOLUTION | Freq: Three times a day (TID) | ORAL | Status: DC | PRN

## 2013-11-11 NOTE — Progress Notes (Signed)
   Subjective:    Patient ID: Michael Branch, male    DOB: 1955/05/17, 58 y.o.   MRN: [id number]  HPI C/o cough and uri sx's for over a week.  He is having severe and persistent cough which is causing him to have headache.  He is on ACE and c/o chronic dry hacky cough.    Review of Systems No chest pain, SOB, HA, dizziness, vision change, N/V, diarrhea, constipation, dysuria, urinary urgency or frequency, myalgias, arthralgias or rash.     Objective:   Physical Exam  Vital signs noted  Well developed well nourished male.  HEENT - Head atraumatic Normocephalic                Eyes - PERRLA, Conjuctiva - clear Sclera- Clear EOMI                Ears - EAC's Wnl TM's Wnl Gross Hearing WNL                Nose - Nares patent                 Throat - oropharanx wnl Respiratory - Lungs with exp wheezes bilateral Cardiac - RRR S1 and S2 without murmur GI - Abdomen soft Nontender and bowel sounds active x 4 Extremities - No edema. Neuro - Grossly intact.  cxr - no infiltrates Prelimnary reading by Iverson Alamin    Assessment & Plan:  Cough - Plan: DG Chest 2 View, amLODipine (NORVASC) 5 MG tablet, methylPREDNISolone acetate (DEPO-MEDROL) injection 120 mg, levalbuterol (XOPENEX) nebulizer solution 1.25 mg, HYDROcodone-homatropine (HYCODAN) 5-1.5 MG/5ML syrup, amoxicillin (AMOXIL) 875 MG tablet  Acute bronchitis due to other specified organisms - Plan: amLODipine (NORVASC) 5 MG tablet, methylPREDNISolone acetate (DEPO-MEDROL) injection 120 mg, levalbuterol (XOPENEX) nebulizer solution 1.25 mg, HYDROcodone-homatropine (HYCODAN) 5-1.5 MG/5ML syrup, amoxicillin (AMOXIL) 875 MG tablet  HTN - DC lisinopril and start amlodipine.  Lysbeth Penner FNP

## 2013-11-17 ENCOUNTER — Other Ambulatory Visit: Payer: Self-pay | Admitting: Family Medicine

## 2013-11-17 ENCOUNTER — Telehealth: Payer: Self-pay | Admitting: Family Medicine

## 2013-11-17 NOTE — Telephone Encounter (Signed)
Please follow up

## 2013-11-18 NOTE — Telephone Encounter (Signed)
Also complains of an increase in dizziness. Appt scheduled for tomorrow at 11:00 with Dietrich Pates, FNP to reevaluate and to address dizziness.  Patient has experienced dizziness in the past but it improved once he was placed on Lisinopril. It has returned since discontinuing Lisinopril.  He also questioned his prescription for amitriptyline. It was written for #30 and to take one daily. He normally receives #60 and instructions are 1-2 daily. He has been trying to cut back. Asked him to discuss with [facility] tomorrow.

## 2013-11-19 ENCOUNTER — Encounter: Payer: Self-pay | Admitting: Family Medicine

## 2013-11-19 ENCOUNTER — Ambulatory Visit (INDEPENDENT_AMBULATORY_CARE_PROVIDER_SITE_OTHER): Payer: Medicare Other | Admitting: Family Medicine

## 2013-11-19 VITALS — BP 111/79 | HR 102 | Temp 97.1°F | Ht 64.5 in | Wt 166.0 lb

## 2013-11-19 DIAGNOSIS — L219 Seborrheic dermatitis, unspecified: Secondary | ICD-10-CM

## 2013-11-19 DIAGNOSIS — M545 Low back pain, unspecified: Secondary | ICD-10-CM

## 2013-11-19 MED ORDER — SELENIUM SULFIDE 2.5 % EX LOTN: 1.0000 "application " | TOPICAL_LOTION | Freq: Every day | CUTANEOUS | Status: DC | PRN

## 2013-11-19 MED ORDER — HYDROCODONE-ACETAMINOPHEN 5-325 MG PO TABS: 1.0000 | ORAL_TABLET | Freq: Four times a day (QID) | ORAL | Status: DC | PRN

## 2013-11-19 MED ORDER — METHYLPREDNISOLONE (PAK) 4 MG PO TABS: ORAL_TABLET | ORAL | Status: DC

## 2013-11-20 NOTE — Progress Notes (Signed)
   Subjective:    Patient ID: Michael Branch, male    DOB: [birth year], 58 y.o.   MRN: [id number]  HPI  C/o low back pain and dry rash or seborrhea on scalp  Review of Systems C/o LBP   No chest pain, SOB, HA, dizziness, vision change, N/V, diarrhea, constipation, dysuria, urinary urgency or frequency or rash.   Objective:   Physical Exam    Vital signs noted  Well developed well nourished male.  HEENT - Head atraumatic Normocephalic Respiratory - Lungs CTA bilateral Cardiac - RRR S1 and S2 without murmur MS - TTP LS spine  Assessment & Plan:  Seborrhea - Plan: selenium sulfide (SELSUN) 2.5 % shampoo  Right-sided low back pain without sciatica - Plan: methylPREDNIsolone (MEDROL DOSPACK) 4 MG tablet, HYDROcodone-acetaminophen (NORCO) 5-325 MG per tablet  Lysbeth Penner FNP

## 2013-12-08 ENCOUNTER — Ambulatory Visit (INDEPENDENT_AMBULATORY_CARE_PROVIDER_SITE_OTHER): Payer: Medicare Other | Admitting: Family Medicine

## 2013-12-08 ENCOUNTER — Encounter: Payer: Self-pay | Admitting: Family Medicine

## 2013-12-08 ENCOUNTER — Encounter (INDEPENDENT_AMBULATORY_CARE_PROVIDER_SITE_OTHER): Payer: Self-pay

## 2013-12-08 VITALS — BP 111/82 | HR 114 | Temp 95.9°F | Ht 64.5 in | Wt 165.5 lb

## 2013-12-08 DIAGNOSIS — J209 Acute bronchitis, unspecified: Secondary | ICD-10-CM

## 2013-12-08 DIAGNOSIS — G8929 Other chronic pain: Secondary | ICD-10-CM

## 2013-12-08 DIAGNOSIS — M542 Cervicalgia: Secondary | ICD-10-CM

## 2013-12-08 MED ORDER — SODIUM CHLORIDE 0.9 % IV SOLN: 125.0000 mg | Freq: Once | INTRAVENOUS | Status: DC

## 2013-12-08 MED ORDER — HYDROCODONE-HOMATROPINE 5-1.5 MG/5ML PO SYRP: 5.0000 mL | ORAL_SOLUTION | Freq: Three times a day (TID) | ORAL | Status: DC | PRN

## 2013-12-08 MED ORDER — LEVOFLOXACIN 500 MG PO TABS: 500.0000 mg | ORAL_TABLET | Freq: Every day | ORAL | Status: DC

## 2013-12-08 MED ORDER — LEVALBUTEROL HCL 1.25 MG/3ML IN NEBU
1.2500 mg | INHALATION_SOLUTION | Freq: Once | RESPIRATORY_TRACT | Status: AC
  Administered 2013-12-08: 1.25 mg via RESPIRATORY_TRACT

## 2013-12-08 MED ORDER — LEVALBUTEROL HCL 1.25 MG/3ML IN NEBU: 1.2500 mg | INHALATION_SOLUTION | RESPIRATORY_TRACT | Status: DC | PRN

## 2013-12-08 MED ORDER — LEVALBUTEROL HCL 1.25 MG/0.5ML IN NEBU: 1.2500 mg | INHALATION_SOLUTION | Freq: Once | RESPIRATORY_TRACT | Status: DC

## 2013-12-08 MED ORDER — PREDNISONE 10 MG PO TABS: ORAL_TABLET | ORAL | Status: DC

## 2013-12-08 MED ORDER — TAPENTADOL HCL 50 MG PO TABS: 50.0000 mg | ORAL_TABLET | Freq: Four times a day (QID) | ORAL | Status: DC | PRN

## 2013-12-08 MED ORDER — METHYLPREDNISOLONE SODIUM SUCC 125 MG IJ SOLR
125.0000 mg | Freq: Once | INTRAMUSCULAR | Status: AC
  Administered 2013-12-08: 125 mg via INTRAMUSCULAR

## 2013-12-08 NOTE — Progress Notes (Signed)
   Subjective:    Patient ID: Michael Branch, male    DOB: Jun 23, 1955, 58 y.o.   MRN: [id number]  HPI C/o cough and bronchitis sx's.  He has not been using his SABA MDI.  He was better but then started to flare a few days ago and now he has severe persistent cough.  He needs refills on his pain medicine.   Review of Systems    No chest pain, SOB, HA, dizziness, vision change, N/V, diarrhea, constipation, dysuria, urinary urgency or frequency, myalgias, arthralgias or rash.  Objective:   Physical Exam  Vital signs noted  Well developed well nourished male.  HEENT - Head atraumatic Normocephalic                Eyes - PERRLA, Conjuctiva - clear Sclera- Clear EOMI                Ears - EAC's Wnl TM's Wnl Gross Hearing WNL                Nose - Nares patent                 Throat - oropharanx wnl Respiratory - Lungs CTA bilateral Cardiac - RRR S1 and S2 without murmur GI - Abdomen soft Nontender and bowel sounds active x 4 Extremities - No edema. Neuro - Grossly intact.      Assessment & Plan:  Acute bronchitis, unspecified organism - Plan: levofloxacin (LEVAQUIN) 500 MG tablet, predniSONE (DELTASONE) 10 MG tablet, levalbuterol (XOPENEX) nebulizer solution 1.25 mg, methylPREDNISolone sodium succinate (SOLU-MEDROL) 130 mg in sodium chloride 0.9 % 50 mL IVPB, HYDROcodone-homatropine (HYCODAN) 5-1.5 MG/5ML syrup, DISCONTINUED: HYDROcodone-homatropine (HYCODAN) 5-1.5 MG/5ML syrup  Chronic neck pain - Plan: tapentadol (NUCYNTA) 50 MG TABS tablet  Lysbeth Penner FNP

## 2013-12-08 NOTE — Addendum Note (Signed)
Addended by: Marin Olp on: 12/08/2013 06:18 PM   Modules accepted: Orders

## 2013-12-23 ENCOUNTER — Ambulatory Visit (AMBULATORY_SURGERY_CENTER): Payer: No Typology Code available for payment source

## 2013-12-23 VITALS — Ht 65.0 in | Wt 165.6 lb

## 2013-12-23 DIAGNOSIS — Z800 Family history of malignant neoplasm of digestive organs: Secondary | ICD-10-CM

## 2013-12-23 DIAGNOSIS — Z86010 Personal history of colonic polyps: Secondary | ICD-10-CM

## 2013-12-23 MED ORDER — MOVIPREP 100 G PO SOLR: ORAL | Status: DC

## 2013-12-23 NOTE — Progress Notes (Signed)
Per pt, no allergies to soy or egg products.Pt not taking any weight loss meds or using  O2 at home.    Per pt,some sedation cause elevated B/P and dizziness!

## 2013-12-24 ENCOUNTER — Telehealth: Payer: Self-pay | Admitting: Family

## 2013-12-24 ENCOUNTER — Other Ambulatory Visit: Payer: Self-pay | Admitting: Family Medicine

## 2013-12-24 MED ORDER — AMITRIPTYLINE HCL 50 MG PO TABS: ORAL_TABLET | ORAL | Status: DC

## 2014-01-01 ENCOUNTER — Telehealth: Payer: Self-pay | Admitting: Family Medicine

## 2014-01-01 NOTE — Telephone Encounter (Signed)
Appt given for tomorrow per patient request

## 2014-01-02 ENCOUNTER — Encounter: Payer: Self-pay | Admitting: Family Medicine

## 2014-01-02 ENCOUNTER — Ambulatory Visit (INDEPENDENT_AMBULATORY_CARE_PROVIDER_SITE_OTHER): Payer: Medicare Other | Admitting: Family Medicine

## 2014-01-02 VITALS — BP 105/72 | HR 111 | Temp 97.1°F | Ht 64.5 in | Wt 165.0 lb

## 2014-01-02 DIAGNOSIS — J209 Acute bronchitis, unspecified: Secondary | ICD-10-CM

## 2014-01-02 DIAGNOSIS — R05 Cough: Secondary | ICD-10-CM

## 2014-01-02 DIAGNOSIS — G8929 Other chronic pain: Secondary | ICD-10-CM

## 2014-01-02 DIAGNOSIS — M542 Cervicalgia: Secondary | ICD-10-CM

## 2014-01-02 DIAGNOSIS — R059 Cough, unspecified: Secondary | ICD-10-CM

## 2014-01-02 DIAGNOSIS — J208 Acute bronchitis due to other specified organisms: Secondary | ICD-10-CM

## 2014-01-02 DIAGNOSIS — R0602 Shortness of breath: Secondary | ICD-10-CM

## 2014-01-02 MED ORDER — PREDNISONE 10 MG PO TABS: ORAL_TABLET | ORAL | Status: DC

## 2014-01-02 MED ORDER — ALBUTEROL SULFATE HFA 108 (90 BASE) MCG/ACT IN AERS: 2.0000 | INHALATION_SPRAY | Freq: Four times a day (QID) | RESPIRATORY_TRACT | Status: DC | PRN

## 2014-01-02 MED ORDER — AMITRIPTYLINE HCL 75 MG PO TABS: 75.0000 mg | ORAL_TABLET | Freq: Every day | ORAL | Status: DC

## 2014-01-02 MED ORDER — HYDROCODONE-ACETAMINOPHEN 5-325 MG PO TABS: 1.0000 | ORAL_TABLET | Freq: Four times a day (QID) | ORAL | Status: DC | PRN

## 2014-01-02 MED ORDER — LEVOFLOXACIN 500 MG PO TABS: 500.0000 mg | ORAL_TABLET | Freq: Every day | ORAL | Status: DC

## 2014-01-02 NOTE — Progress Notes (Signed)
   Subjective:    Patient ID: Michael Branch, male    DOB: 07/18/1955, 58 y.o.   MRN: [id number]  HPI C/o persistent cough and wheezing.  He has SOB.  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  He was taking Nucynta but cannot afford it anymore.  He has been to see 2 pain doctors and has had problems according to patient.  He is in severe pain and is nucynta is running out and he cannot afford it and wants to try another.  Review of Systems  Constitutional: Negative for fever.  HENT: Negative for ear pain.   Eyes: Negative for discharge.  Respiratory: Negative for cough.   Cardiovascular: Negative for chest pain.  Gastrointestinal: Negative for abdominal distention.  Endocrine: Negative for polyuria.  Genitourinary: Negative for difficulty urinating.  Musculoskeletal: Negative for gait problem and neck pain.  Skin: Negative for color change and rash.  Neurological: Negative for speech difficulty and headaches.  Psychiatric/Behavioral: Negative for agitation.       Objective:    BP 105/72 mmHg  Pulse 111  Temp(Src) 97.1 F (36.2 C) (Oral)  Ht 5' 4.5" (1.638 m)  Wt 165 lb (74.844 kg)  BMI 27.90 kg/m2 Physical Exam  Constitutional: He is oriented to person, place, and time. He appears well-developed and well-nourished.  HENT:  Head: Normocephalic and atraumatic.  Mouth/Throat: Oropharynx is clear and moist.  Eyes: Pupils are equal, round, and reactive to light.  Neck: Normal range of motion. Neck supple.  Cardiovascular: Normal rate and regular rhythm.   No murmur heard. Pulmonary/Chest: Effort normal. He has wheezes.  Abdominal: Soft. Bowel sounds are normal. There is no tenderness.  Neurological: He is alert and oriented to person, place, and time.  Skin: Skin is warm and dry.  Psychiatric: He has a normal mood and affect.          Assessment & Plan:     ICD-9-CM ICD-10-CM   1. Cough 786.2 R05 albuterol (PROVENTIL  HFA;VENTOLIN HFA) 108 (90 BASE) MCG/ACT inhaler  2. Acute bronchitis due to other specified organisms 466.0 J20.8 albuterol (PROVENTIL HFA;VENTOLIN HFA) 108 (90 BASE) MCG/ACT inhaler  3. Acute bronchitis, unspecified organism 466.0 J20.9 levofloxacin (LEVAQUIN) 500 MG tablet     predniSONE (DELTASONE) 10 MG tablet  4. SOB (shortness of breath) 786.05 R06.02 Ambulatory referral to Pulmonology  5. Chronic neck pain 723.1 M54.2 HYDROcodone-acetaminophen (NORCO) 5-325 MG per tablet   338.29 G89.29 amitriptyline (ELAVIL) 75 MG tablet   Explained to patient will need to eventually get into pain management.  No Follow-up on file.  Lysbeth Penner FNP

## 2014-01-06 ENCOUNTER — Encounter: Payer: Self-pay | Admitting: Internal Medicine

## 2014-01-14 ENCOUNTER — Ambulatory Visit: Payer: Medicare Other | Admitting: Family Medicine

## 2014-01-26 ENCOUNTER — Telehealth: Payer: Self-pay | Admitting: Family

## 2014-01-26 DIAGNOSIS — G8929 Other chronic pain: Secondary | ICD-10-CM

## 2014-01-26 DIAGNOSIS — M542 Cervicalgia: Principal | ICD-10-CM

## 2014-01-29 NOTE — Telephone Encounter (Signed)
Last seen and filled 01/02/14. Rx will print

## 2014-01-30 MED ORDER — HYDROCODONE-ACETAMINOPHEN 5-325 MG PO TABS: 1.0000 | ORAL_TABLET | Freq: Four times a day (QID) | ORAL | Status: DC | PRN

## 2014-01-30 NOTE — Telephone Encounter (Signed)
Pt calling to see if presc. Ready.   No presc. Up front.  Call pt [phone]

## 2014-01-30 NOTE — Telephone Encounter (Signed)
Rx upfront and pt aware

## 2014-02-23 ENCOUNTER — Other Ambulatory Visit: Payer: Self-pay | Admitting: Family

## 2014-02-23 ENCOUNTER — Telehealth: Payer: Self-pay | Admitting: *Deleted

## 2014-02-23 DIAGNOSIS — M542 Cervicalgia: Principal | ICD-10-CM

## 2014-02-23 DIAGNOSIS — G8929 Other chronic pain: Secondary | ICD-10-CM

## 2014-02-23 MED ORDER — HYDROCODONE-ACETAMINOPHEN 5-325 MG PO TABS: 1.0000 | ORAL_TABLET | Freq: Four times a day (QID) | ORAL | Status: DC | PRN

## 2014-02-23 NOTE — Telephone Encounter (Signed)
Aware ,script ready. 

## 2014-02-23 NOTE — Telephone Encounter (Signed)
Last seen 01/02/14, last filled 01/30/14. Rx will print

## 2014-03-18 ENCOUNTER — Other Ambulatory Visit: Payer: Self-pay | Admitting: Family

## 2014-03-18 DIAGNOSIS — G894 Chronic pain syndrome: Secondary | ICD-10-CM

## 2014-03-19 NOTE — Telephone Encounter (Signed)
Pt notified too early for refill Also informed of referral to pain management per [facility] understanding

## 2014-04-17 ENCOUNTER — Other Ambulatory Visit: Payer: Self-pay | Admitting: Family

## 2014-04-17 ENCOUNTER — Encounter: Payer: Self-pay | Admitting: Family

## 2014-04-17 ENCOUNTER — Ambulatory Visit (INDEPENDENT_AMBULATORY_CARE_PROVIDER_SITE_OTHER): Payer: Medicare Other | Admitting: Family

## 2014-04-17 ENCOUNTER — Encounter (INDEPENDENT_AMBULATORY_CARE_PROVIDER_SITE_OTHER): Payer: Self-pay

## 2014-04-17 VITALS — BP 131/87 | HR 78 | Temp 96.8°F | Ht 64.5 in | Wt 173.0 lb

## 2014-04-17 DIAGNOSIS — M542 Cervicalgia: Secondary | ICD-10-CM

## 2014-04-17 DIAGNOSIS — G8929 Other chronic pain: Secondary | ICD-10-CM | POA: Diagnosis not present

## 2014-04-17 DIAGNOSIS — E785 Hyperlipidemia, unspecified: Secondary | ICD-10-CM | POA: Diagnosis not present

## 2014-04-17 DIAGNOSIS — G4700 Insomnia, unspecified: Secondary | ICD-10-CM | POA: Diagnosis not present

## 2014-04-17 DIAGNOSIS — I10 Essential (primary) hypertension: Secondary | ICD-10-CM | POA: Diagnosis not present

## 2014-04-17 DIAGNOSIS — Z1321 Encounter for screening for nutritional disorder: Secondary | ICD-10-CM

## 2014-04-17 MED ORDER — CYCLOBENZAPRINE HCL 10 MG PO TABS: 10.0000 mg | ORAL_TABLET | Freq: Three times a day (TID) | ORAL | Status: DC | PRN

## 2014-04-17 MED ORDER — HYDROCODONE-ACETAMINOPHEN 5-325 MG PO TABS: 1.0000 | ORAL_TABLET | Freq: Four times a day (QID) | ORAL | Status: DC | PRN

## 2014-04-17 NOTE — Patient Instructions (Signed)

## 2014-04-17 NOTE — Progress Notes (Signed)
Subjective:    Patient ID: Michael Branch, male    DOB: [birth year], 59 y.o.   MRN: [id number]  Hyperlipidemia This is a chronic problem. The current episode started more than 1 year ago. The problem is controlled. Recent lipid tests were reviewed and are normal. He has no history of diabetes. Pertinent negatives include no leg pain or shortness of breath. Current antihyperlipidemic treatment includes diet change. The current treatment provides mild improvement of lipids. Risk factors for coronary artery disease include dyslipidemia, hypertension, male sex, obesity and a sedentary lifestyle.  Hypertension This is a chronic problem. The current episode started more than 1 year ago. The problem has been resolved since onset. The problem is controlled. Associated symptoms include headaches. Pertinent negatives include no anxiety, malaise/fatigue, palpitations, peripheral edema or shortness of breath. Risk factors for coronary artery disease include dyslipidemia and male gender. Past treatments include calcium channel blockers and diuretics. The current treatment provides mild improvement. There is no history of kidney disease, CAD/MI, CVA, heart failure or a thyroid problem. There is no history of sleep apnea.  Insomnia Pt currently taking amitriptyline 75 mcg at bedtime every night. PT states it helps a lot. No complaints at this time.     Review of Systems  Constitutional: Negative.  Negative for malaise/fatigue.  Respiratory: Negative.  Negative for shortness of breath.   Cardiovascular: Negative.  Negative for palpitations.  Gastrointestinal: Negative.   Endocrine: Negative.   Genitourinary: Negative.   Musculoskeletal: Negative.   Neurological: Positive for headaches.  Hematological: Negative.   Psychiatric/Behavioral: Negative.   All other systems reviewed and are negative.      Objective:   Physical Exam  Constitutional: He is oriented to person, place, and time. He appears  well-developed and well-nourished. No distress.  HENT:  Head: Normocephalic.  Right Ear: External ear normal.  Left Ear: External ear normal.  Nose: Nose normal.  Mouth/Throat: Oropharynx is clear and moist.  Eyes: Pupils are equal, round, and reactive to light. Right eye exhibits no discharge. Left eye exhibits no discharge.  Neck: Normal range of motion. Neck supple. No thyromegaly present.  Cardiovascular: Normal rate, regular rhythm, normal heart sounds and intact distal pulses.   No murmur heard. Pulmonary/Chest: Effort normal and breath sounds normal. No respiratory distress. He has no wheezes.  Abdominal: Soft. Bowel sounds are normal. He exhibits no distension. There is no tenderness.  Musculoskeletal: Normal range of motion. He exhibits no edema or tenderness.  Neurological: He is alert and oriented to person, place, and time. He has normal reflexes. No cranial nerve deficit.  Skin: Skin is warm and dry. No rash noted. No erythema.  Psychiatric: He has a normal mood and affect. His behavior is normal. Judgment and thought content normal.  Vitals reviewed.   BP 131/87 mmHg  Pulse 78  Temp(Src) 96.8 F (36 C) (Oral)  Ht 5' 4.5" (1.638 m)  Wt 173 lb (78.472 kg)  BMI 29.25 kg/m2       Assessment & Plan:  1. Essential hypertension - CMP14+EGFR  2. Insomnia - CMP14+EGFR  3. Hyperlipidemia - CMP14+EGFR - Lipid panel  4. Chronic neck pain - CMP14+EGFR - HYDROcodone-acetaminophen (NORCO) 5-325 MG per tablet; Take 1-2 tablets by mouth every 6 (six) hours as needed for moderate pain.  Dispense: 90 tablet; Refill: 0  5. Encounter for vitamin deficiency screening - Vit D  25 hydroxy (rtn osteoporosis monitoring)   Continue all meds Labs pending Health Maintenance reviewed Diet and exercise encouraged RTO  6 months   Evelina Dun, FNP

## 2014-04-18 LAB — LIPID PANEL
Chol/HDL Ratio: 4.3 ratio units (ref 0.0–5.0)
Cholesterol, Total: 303 mg/dL — ABNORMAL HIGH (ref 100–199)
HDL: 71 mg/dL (ref >39)
LDL Calculated: 170 mg/dL — ABNORMAL HIGH (ref 0–99)
TRIGLYCERIDES: 309 mg/dL — AB (ref 0–149)
VLDL Cholesterol Cal: 62 mg/dL — ABNORMAL HIGH (ref 5–40)

## 2014-04-18 LAB — CMP14+EGFR
ALT: 52 IU/L — ABNORMAL HIGH (ref 0–44)
AST: 29 IU/L (ref 0–40)
Albumin/Globulin Ratio: 2.2 (ref 1.1–2.5)
Albumin: 4.8 g/dL (ref 3.5–5.5)
Alkaline Phosphatase: 78 IU/L (ref 39–117)
BILIRUBIN TOTAL: 0.5 mg/dL (ref 0.0–1.2)
BUN/Creatinine Ratio: 12 (ref 9–20)
BUN: 13 mg/dL (ref 6–24)
CHLORIDE: 98 mmol/L (ref 97–108)
CO2: 30 mmol/L — ABNORMAL HIGH (ref 18–29)
CREATININE: 1.12 mg/dL (ref 0.76–1.27)
Calcium: 10.3 mg/dL — ABNORMAL HIGH (ref 8.7–10.2)
GFR calc Af Amer: 83 mL/min/{1.73_m2} (ref >59)
GFR, EST NON AFRICAN AMERICAN: 72 mL/min/{1.73_m2} (ref >59)
Globulin, Total: 2.2 g/dL (ref 1.5–4.5)
Glucose: 87 mg/dL (ref 65–99)
Potassium: 4.5 mmol/L (ref 3.5–5.2)
Sodium: 140 mmol/L (ref 134–144)
TOTAL PROTEIN: 7 g/dL (ref 6.0–8.5)

## 2014-04-18 LAB — VITAMIN D 25 HYDROXY (VIT D DEFICIENCY, FRACTURES): Vit D, 25-Hydroxy: 25.5 ng/mL — ABNORMAL LOW (ref 30.0–100.0)

## 2014-04-20 ENCOUNTER — Other Ambulatory Visit: Payer: Self-pay | Admitting: Family

## 2014-04-20 DIAGNOSIS — E559 Vitamin D deficiency, unspecified: Secondary | ICD-10-CM | POA: Insufficient documentation

## 2014-04-20 MED ORDER — PRAVASTATIN SODIUM 20 MG PO TABS: 20.0000 mg | ORAL_TABLET | Freq: Every day | ORAL | Status: DC

## 2014-04-20 MED ORDER — VITAMIN D (ERGOCALCIFEROL) 1.25 MG (50000 UNIT) PO CAPS: 50000.0000 [IU] | ORAL_CAPSULE | ORAL | Status: DC

## 2014-05-12 ENCOUNTER — Other Ambulatory Visit: Payer: Self-pay | Admitting: Family

## 2014-05-12 DIAGNOSIS — M542 Cervicalgia: Principal | ICD-10-CM

## 2014-05-12 DIAGNOSIS — G8929 Other chronic pain: Secondary | ICD-10-CM

## 2014-05-12 MED ORDER — HYDROCODONE-ACETAMINOPHEN 5-325 MG PO TABS: 1.0000 | ORAL_TABLET | Freq: Four times a day (QID) | ORAL | Status: DC | PRN

## 2014-05-12 NOTE — Telephone Encounter (Signed)
RX ready for pick up 

## 2014-05-12 NOTE — Telephone Encounter (Signed)
Detailed message left for patient that rx is ready to be picked up 

## 2014-06-10 ENCOUNTER — Telehealth: Payer: Self-pay | Admitting: Family

## 2014-06-10 DIAGNOSIS — G8929 Other chronic pain: Secondary | ICD-10-CM

## 2014-06-10 DIAGNOSIS — M542 Cervicalgia: Principal | ICD-10-CM

## 2014-06-10 MED ORDER — HYDROCODONE-ACETAMINOPHEN 5-325 MG PO TABS: 1.0000 | ORAL_TABLET | Freq: Four times a day (QID) | ORAL | Status: DC | PRN

## 2014-06-10 NOTE — Telephone Encounter (Signed)
RX ready for pick up 

## 2014-06-10 NOTE — Telephone Encounter (Signed)
lmovm that Rx was at front office ready to be picked up

## 2014-07-09 ENCOUNTER — Telehealth: Payer: Self-pay | Admitting: Family

## 2014-07-09 DIAGNOSIS — M542 Cervicalgia: Principal | ICD-10-CM

## 2014-07-09 DIAGNOSIS — G8929 Other chronic pain: Secondary | ICD-10-CM

## 2014-07-09 NOTE — Telephone Encounter (Signed)
Please review and advise.

## 2014-07-10 MED ORDER — HYDROCODONE-ACETAMINOPHEN 5-325 MG PO TABS: 1.0000 | ORAL_TABLET | Freq: Four times a day (QID) | ORAL | Status: DC | PRN

## 2014-07-10 NOTE — Telephone Encounter (Signed)
lmovm that written Rx at front desk ready for pickup

## 2014-07-10 NOTE — Telephone Encounter (Signed)
RX ready for pick up 

## 2014-07-27 ENCOUNTER — Other Ambulatory Visit: Payer: Self-pay | Admitting: Family

## 2014-08-10 ENCOUNTER — Telehealth: Payer: Self-pay | Admitting: Family

## 2014-08-10 DIAGNOSIS — G8929 Other chronic pain: Secondary | ICD-10-CM

## 2014-08-10 DIAGNOSIS — M542 Cervicalgia: Principal | ICD-10-CM

## 2014-08-10 MED ORDER — HYDROCODONE-ACETAMINOPHEN 5-325 MG PO TABS: 1.0000 | ORAL_TABLET | Freq: Four times a day (QID) | ORAL | Status: DC | PRN

## 2014-08-10 NOTE — Telephone Encounter (Signed)
RX ready for pick up 

## 2014-08-10 NOTE — Telephone Encounter (Signed)
Spoke with pt- notified that prescription is ready to be picked up.

## 2014-09-09 ENCOUNTER — Telehealth: Payer: Self-pay | Admitting: Family

## 2014-09-09 DIAGNOSIS — M542 Cervicalgia: Principal | ICD-10-CM

## 2014-09-09 DIAGNOSIS — G8929 Other chronic pain: Secondary | ICD-10-CM

## 2014-09-09 MED ORDER — HYDROCODONE-ACETAMINOPHEN 5-325 MG PO TABS: 1.0000 | ORAL_TABLET | Freq: Four times a day (QID) | ORAL | Status: DC | PRN

## 2014-09-09 NOTE — Telephone Encounter (Signed)
Detailed message left that rx is ready to be picked up.  

## 2014-09-11 DIAGNOSIS — H52221 Regular astigmatism, right eye: Secondary | ICD-10-CM | POA: Diagnosis not present

## 2014-09-11 DIAGNOSIS — H524 Presbyopia: Secondary | ICD-10-CM | POA: Diagnosis not present

## 2014-09-11 DIAGNOSIS — H5203 Hypermetropia, bilateral: Secondary | ICD-10-CM | POA: Diagnosis not present

## 2014-09-11 DIAGNOSIS — H5702 Anisocoria: Secondary | ICD-10-CM | POA: Diagnosis not present

## 2014-10-08 ENCOUNTER — Telehealth: Payer: Self-pay | Admitting: Family

## 2014-10-08 DIAGNOSIS — G8929 Other chronic pain: Secondary | ICD-10-CM

## 2014-10-08 DIAGNOSIS — M542 Cervicalgia: Principal | ICD-10-CM

## 2014-10-08 MED ORDER — HYDROCODONE-ACETAMINOPHEN 5-325 MG PO TABS: 1.0000 | ORAL_TABLET | Freq: Four times a day (QID) | ORAL | Status: DC | PRN

## 2014-10-08 NOTE — Telephone Encounter (Signed)
Patient aware that script is available for pickup at front desk

## 2014-10-08 NOTE — Telephone Encounter (Signed)
RX ready for pick up 

## 2014-10-16 ENCOUNTER — Encounter (INDEPENDENT_AMBULATORY_CARE_PROVIDER_SITE_OTHER): Payer: Self-pay

## 2014-10-16 ENCOUNTER — Ambulatory Visit (INDEPENDENT_AMBULATORY_CARE_PROVIDER_SITE_OTHER): Payer: Medicare Other | Admitting: Family

## 2014-10-16 ENCOUNTER — Encounter: Payer: Self-pay | Admitting: Family

## 2014-10-16 VITALS — BP 113/81 | HR 81 | Temp 97.2°F | Ht 64.5 in | Wt 174.0 lb

## 2014-10-16 DIAGNOSIS — Z1159 Encounter for screening for other viral diseases: Secondary | ICD-10-CM | POA: Diagnosis not present

## 2014-10-16 DIAGNOSIS — J208 Acute bronchitis due to other specified organisms: Secondary | ICD-10-CM | POA: Diagnosis not present

## 2014-10-16 DIAGNOSIS — R05 Cough: Secondary | ICD-10-CM

## 2014-10-16 DIAGNOSIS — G8929 Other chronic pain: Secondary | ICD-10-CM | POA: Diagnosis not present

## 2014-10-16 DIAGNOSIS — E559 Vitamin D deficiency, unspecified: Secondary | ICD-10-CM

## 2014-10-16 DIAGNOSIS — I10 Essential (primary) hypertension: Secondary | ICD-10-CM

## 2014-10-16 DIAGNOSIS — M542 Cervicalgia: Secondary | ICD-10-CM | POA: Diagnosis not present

## 2014-10-16 DIAGNOSIS — E785 Hyperlipidemia, unspecified: Secondary | ICD-10-CM | POA: Diagnosis not present

## 2014-10-16 DIAGNOSIS — G4700 Insomnia, unspecified: Secondary | ICD-10-CM | POA: Diagnosis not present

## 2014-10-16 DIAGNOSIS — R5383 Other fatigue: Secondary | ICD-10-CM

## 2014-10-16 DIAGNOSIS — R059 Cough, unspecified: Secondary | ICD-10-CM

## 2014-10-16 MED ORDER — HYDROCODONE-ACETAMINOPHEN 7.5-325 MG PO TABS: 1.0000 | ORAL_TABLET | Freq: Four times a day (QID) | ORAL | Status: DC | PRN

## 2014-10-16 MED ORDER — AMLODIPINE BESYLATE 5 MG PO TABS: 5.0000 mg | ORAL_TABLET | Freq: Every day | ORAL | Status: DC

## 2014-10-16 MED ORDER — HYDROCHLOROTHIAZIDE 25 MG PO TABS: ORAL_TABLET | ORAL | Status: DC

## 2014-10-16 NOTE — Progress Notes (Signed)
Subjective:    Patient ID: Michael Branch, male    DOB: [birth year], 59 y.o.   MRN: [id number]  Pt presents to the office today for chronic follow up. Pt states he feels fatigue and having pain in his legs. Pt states he had neck surgery in 2002 and states he has nerve damage. Pt states he has seen a rheumatologists, neurologists, and pain clinic in the past with no relief.  Hypertension This is a chronic problem. The current episode started more than 1 year ago. The problem has been resolved since onset. The problem is controlled. Associated symptoms include headaches. Pertinent negatives include no anxiety, malaise/fatigue, palpitations, peripheral edema or shortness of breath. Risk factors for coronary artery disease include dyslipidemia and male gender. Past treatments include calcium channel blockers and diuretics. The current treatment provides mild improvement. There is no history of kidney disease, CAD/MI, CVA, heart failure or a thyroid problem. There is no history of sleep apnea.  Hyperlipidemia This is a chronic problem. The current episode started more than 1 year ago. The problem is uncontrolled. Recent lipid tests were reviewed and are high. He has no history of diabetes. Pertinent negatives include no leg pain or shortness of breath. Current antihyperlipidemic treatment includes diet change and statins. The current treatment provides mild improvement of lipids. Risk factors for coronary artery disease include dyslipidemia, hypertension, male sex, obesity and a sedentary lifestyle.  Insomnia Pt currently taking amitriptyline 75 mcg at bedtime every night. PT states it helps a lot. No complaints at this time.     Review of Systems  Constitutional: Positive for fatigue. Negative for malaise/fatigue.  Respiratory: Negative.  Negative for shortness of breath.   Cardiovascular: Negative.  Negative for palpitations.  Gastrointestinal: Negative.   Endocrine: Negative.   Genitourinary:  Negative.   Musculoskeletal: Negative.   Neurological: Positive for headaches.  Hematological: Negative.   Psychiatric/Behavioral: Negative.   All other systems reviewed and are negative.      Objective:   Physical Exam  Constitutional: He is oriented to person, place, and time. He appears well-developed and well-nourished. No distress.  HENT:  Head: Normocephalic.  Right Ear: External ear normal.  Left Ear: External ear normal.  Nose: Nose normal.  Mouth/Throat: Oropharynx is clear and moist.  Eyes: Pupils are equal, round, and reactive to light. Right eye exhibits no discharge. Left eye exhibits no discharge.  Neck: Normal range of motion. Neck supple. No thyromegaly present.  Cardiovascular: Normal rate, regular rhythm, normal heart sounds and intact distal pulses.   No murmur heard. Pulmonary/Chest: Effort normal and breath sounds normal. No respiratory distress. He has no wheezes.  Abdominal: Soft. Bowel sounds are normal. He exhibits no distension. There is no tenderness.  Musculoskeletal: Normal range of motion. He exhibits no edema or tenderness.  Neurological: He is alert and oriented to person, place, and time. He has normal reflexes. No cranial nerve deficit.  Skin: Skin is warm and dry. No rash noted. No erythema.  Psychiatric: He has a normal mood and affect. His behavior is normal. Judgment and thought content normal.  Vitals reviewed.     BP 113/81 mmHg  Pulse 81  Temp(Src) 97.2 F (36.2 C) (Oral)  Ht 5' 4.5" (1.638 m)  Wt 174 lb (78.926 kg)  BMI 29.42 kg/m2     Assessment & Plan:  1. Vitamin D deficiency - CMP14+EGFR - Vit D  25 hydroxy (rtn osteoporosis monitoring)  2. Chronic neck pain -Pt to take daily stool softener  -  CMP14+EGFR - Arthritis Panel - HYDROcodone-acetaminophen (NORCO) 7.5-325 MG per tablet; Take 1 tablet by mouth every 6 (six) hours as needed for moderate pain.  Dispense: 90 tablet; Refill: 0  3. Insomnia - CMP14+EGFR  4.  Hyperlipidemia - CMP14+EGFR - Lipid panel  5. Essential hypertension - CMP14+EGFR  6. Other fatigue - Anemia Profile B - CMP14+EGFR - Thyroid Panel With TSH - Arthritis Panel  7. Need for hepatitis C screening test - CMP14+EGFR - Hepatitis C antibody   Continue all meds Labs pending Health Maintenance reviewed Diet and exercise encouraged RTO 3 months for chronic followup  Evelina Dun, FNP

## 2014-10-16 NOTE — Patient Instructions (Addendum)
Health Maintenance A healthy lifestyle and preventative care can promote health and wellness.  Maintain regular health, dental, and eye exams.  Eat a healthy diet. Foods like vegetables, fruits, whole grains, low-fat dairy products, and lean protein foods contain the nutrients you need and are low in calories. Decrease your intake of foods high in solid fats, added sugars, and salt. Get information about a proper diet from your health care provider, if necessary.  Regular physical exercise is one of the most important things you can do for your health. Most adults should get at least 150 minutes of moderate-intensity exercise (any activity that increases your heart rate and causes you to sweat) each week. In addition, most adults need muscle-strengthening exercises on 2 or more days a week.   Maintain a healthy weight. The body mass index (BMI) is a screening tool to identify possible weight problems. It provides an estimate of body fat based on height and weight. Your health care provider can find your BMI and can help you achieve or maintain a healthy weight. For males 20 years and older:  A BMI below 18.5 is considered underweight.  A BMI of 18.5 to 24.9 is normal.  A BMI of 25 to 29.9 is considered overweight.  A BMI of 30 and above is considered obese.  Maintain normal blood lipids and cholesterol by exercising and minimizing your intake of saturated fat. Eat a balanced diet with plenty of fruits and vegetables. Blood tests for lipids and cholesterol should begin at age 20 and be repeated every 5 years. If your lipid or cholesterol levels are high, you are over age 50, or you are at high risk for heart disease, you may need your cholesterol levels checked more frequently.Ongoing high lipid and cholesterol levels should be treated with medicines if diet and exercise are not working.  If you smoke, find out from your health care provider how to quit. If you do not use tobacco, do not  start.  Lung cancer screening is recommended for adults aged 55-80 years who are at high risk for developing lung cancer because of a history of smoking. A yearly low-dose CT scan of the lungs is recommended for people who have at least a 30-pack-year history of smoking and are current smokers or have quit within the past 15 years. A pack year of smoking is smoking an average of 1 pack of cigarettes a day for 1 year (for example, a 30-pack-year history of smoking could mean smoking 1 pack a day for 30 years or 2 packs a day for 15 years). Yearly screening should continue until the smoker has stopped smoking for at least 15 years. Yearly screening should be stopped for people who develop a health problem that would prevent them from having lung cancer treatment.  If you choose to drink alcohol, do not have more than 2 drinks per day. One drink is considered to be 12 oz (360 mL) of beer, 5 oz (150 mL) of wine, or 1.5 oz (45 mL) of liquor.  Avoid the use of street drugs. Do not share needles with anyone. Ask for help if you need support or instructions about stopping the use of drugs.  High blood pressure causes heart disease and increases the risk of stroke. Blood pressure should be checked at least every 1-2 years. Ongoing high blood pressure should be treated with medicines if weight loss and exercise are not effective.  If you are 45-79 years old, ask your health care provider if   you should take aspirin to prevent heart disease.  Diabetes screening involves taking a blood sample to check your fasting blood sugar level. This should be done once every 3 years after age 45 if you are at a normal weight and without risk factors for diabetes. Testing should be considered at a younger age or be carried out more frequently if you are overweight and have at least 1 risk factor for diabetes.  Colorectal cancer can be detected and often prevented. Most routine colorectal cancer screening begins at the age of 50  and continues through age 75. However, your health care provider may recommend screening at an earlier age if you have risk factors for colon cancer. On a yearly basis, your health care provider may provide home test kits to check for hidden blood in the stool. A small camera at the end of a tube may be used to directly examine the colon (sigmoidoscopy or colonoscopy) to detect the earliest forms of colorectal cancer. Talk to your health care provider about this at age 50 when routine screening begins. A direct exam of the colon should be repeated every 5-10 years through age 75, unless early forms of precancerous polyps or small growths are found.  People who are at an increased risk for hepatitis B should be screened for this virus. You are considered at high risk for hepatitis B if:  You were born in a country where hepatitis B occurs often. Talk with your health care provider about which countries are considered high risk.  Your parents were born in a high-risk country and you have not received a shot to protect against hepatitis B (hepatitis B vaccine).  You have HIV or AIDS.  You use needles to inject street drugs.  You live with, or have sex with, someone who has hepatitis B.  You are a man who has sex with other men (MSM).  You get hemodialysis treatment.  You take certain medicines for conditions like cancer, organ transplantation, and autoimmune conditions.  Hepatitis C blood testing is recommended for all people born from 1945 through 1965 and any individual with known risk factors for hepatitis C.  Healthy men should no longer receive prostate-specific antigen (PSA) blood tests as part of routine cancer screening. Talk to your health care provider about prostate cancer screening.  Testicular cancer screening is not recommended for adolescents or adult males who have no symptoms. Screening includes self-exam, a health care provider exam, and other screening tests. Consult with your  health care provider about any symptoms you have or any concerns you have about testicular cancer.  Practice safe sex. Use condoms and avoid high-risk sexual practices to reduce the spread of sexually transmitted infections (STIs).  You should be screened for STIs, including gonorrhea and chlamydia if:  You are sexually active and are younger than 24 years.  You are older than 24 years, and your health care provider tells you that you are at risk for this type of infection.  Your sexual activity has changed since you were last screened, and you are at an increased risk for chlamydia or gonorrhea. Ask your health care provider if you are at risk.  If you are at risk of being infected with HIV, it is recommended that you take a prescription medicine daily to prevent HIV infection. This is called pre-exposure prophylaxis (PrEP). You are considered at risk if:  You are a man who has sex with other men (MSM).  You are a heterosexual man who   is sexually active with multiple partners.  You take drugs by injection.  You are sexually active with a partner who has HIV.  Talk with your health care provider about whether you are at high risk of being infected with HIV. If you choose to begin PrEP, you should first be tested for HIV. You should then be tested every 3 months for as long as you are taking PrEP.  Use sunscreen. Apply sunscreen liberally and repeatedly throughout the day. You should seek shade when your shadow is shorter than you. Protect yourself by wearing long sleeves, pants, a wide-brimmed hat, and sunglasses year round whenever you are outdoors.  Tell your health care provider of new moles or changes in moles, especially if there is a change in shape or color. Also, tell your health care provider if a mole is larger than the size of a pencil eraser.  A one-time screening for abdominal aortic aneurysm (AAA) and surgical repair of large AAAs by ultrasound is recommended for men aged  47-75 years who are current or former smokers.  Stay current with your vaccines (immunizations). Document Released: 08/05/2007 Document Revised: 02/11/2013 Document Reviewed: 07/04/2010 [facility] Patient Information 2015 [place], [place]. This information is not intended to replace advice given to you by Fatigue Fatigue is a feeling of tiredness, lack of energy, lack of motivation, or feeling tired all the time. Having enough rest, good nutrition, and reducing stress will normally reduce fatigue. Consult your caregiver if it persists. The nature of your fatigue will help your caregiver to find out its cause. The treatment is based on the cause.  CAUSES  There are many causes for fatigue. Most of the time, fatigue can be traced to one or more of your habits or routines. Most causes fit into one or more of three general areas. They are: Lifestyle problems Sleep disturbances. Overwork. Physical exertion. Unhealthy habits. Poor eating habits or eating disorders. Alcohol and/or drug use . Lack of proper nutrition (malnutrition). Psychological problems Stress and/or anxiety problems. Depression. Grief. Boredom. Medical Problems or Conditions Anemia. Pregnancy. Thyroid gland problems. Recovery from major surgery. Continuous pain. Emphysema or asthma that is not well controlled Allergic conditions. Diabetes. Infections (such as mononucleosis). Obesity. Sleep disorders, such as sleep apnea. Heart failure or other heart-related problems. Cancer. Kidney disease. Liver disease. Effects of certain medicines such as antihistamines, cough and cold remedies, prescription pain medicines, heart and blood pressure medicines, drugs used for treatment of cancer, and some antidepressants. SYMPTOMS  The symptoms of fatigue include:  Lack of energy. Lack of drive (motivation). Drowsiness. Feeling of indifference to the surroundings. DIAGNOSIS  The details of how you feel help guide your caregiver  in finding out what is causing the fatigue. You will be asked about your present and past health condition. It is important to review all medicines that you take, including prescription and non-prescription items. A thorough exam will be done. You will be questioned about your feelings, habits, and normal lifestyle. Your caregiver may suggest blood tests, urine tests, or other tests to look for common medical causes of fatigue.  TREATMENT  Fatigue is treated by correcting the underlying cause. For example, if you have continuous pain or depression, treating these causes will improve how you feel. Similarly, adjusting the dose of certain medicines will help in reducing fatigue.  HOME CARE INSTRUCTIONS  Try to get the required amount of good sleep every night. Eat a healthy and nutritious diet, and drink enough water throughout the day. Practice ways  of relaxing (including yoga or meditation). Exercise regularly. Make plans to change situations that cause stress. Act on those plans so that stresses decrease over time. Keep your work and personal routine reasonable. Avoid street drugs and minimize use of alcohol. Start taking a daily multivitamin after consulting your caregiver. SEEK MEDICAL CARE IF:  You have persistent tiredness, which cannot be accounted for. You have fever. You have unintentional weight loss. You have headaches. You have disturbed sleep throughout the night. You are feeling sad. You have constipation. You have dry skin. You have gained weight. You are taking any new or different medicines that you suspect are causing fatigue. You are unable to sleep at night. You develop any unusual swelling of your legs or other parts of your body. SEEK IMMEDIATE MEDICAL CARE IF:  You are feeling confused. Your vision is blurred. You feel faint or pass out. You develop severe headache. You develop severe abdominal, pelvic, or back pain. You develop chest pain, shortness of breath, or  an irregular or fast heartbeat. You are unable to pass a normal amount of urine. You develop abnormal bleeding such as bleeding from the rectum or you vomit blood. You have thoughts about harming yourself or committing suicide. You are worried that you might harm someone else. MAKE SURE YOU:  Understand these instructions. Will watch your condition. Will get help right away if you are not doing well or get worse. Document Released: 12/04/2006 Document Revised: 05/01/2011 Document Reviewed: 06/10/2013 [facility] Patient Information 2015 [place], [place]. This information is not intended to replace advice given to you by your health care provider. Make sure you discuss any questions you have with your health care provider. your health care provider. Make sure you discuss any questions you have with your health care provider.

## 2014-10-17 LAB — ANEMIA PROFILE B
Basophils Absolute: 0.1 10*3/uL (ref 0.0–0.2)
Basos: 1 %
EOS (ABSOLUTE): 0.4 10*3/uL (ref 0.0–0.4)
EOS: 6 %
Ferritin: 262 ng/mL (ref 30–400)
Folate: >20 ng/mL (ref >3.0)
Hematocrit: 42.7 % (ref 37.5–51.0)
Hemoglobin: 15.1 g/dL (ref 12.6–17.7)
IMMATURE GRANS (ABS): 0 10*3/uL (ref 0.0–0.1)
IMMATURE GRANULOCYTES: 0 %
IRON SATURATION: 44 % (ref 15–55)
Iron: 136 ug/dL (ref 38–169)
Lymphocytes Absolute: 2.1 10*3/uL (ref 0.7–3.1)
Lymphs: 33 %
MCH: 30.3 pg (ref 26.6–33.0)
MCHC: 35.4 g/dL (ref 31.5–35.7)
MCV: 86 fL (ref 79–97)
MONOS ABS: 0.6 10*3/uL (ref 0.1–0.9)
Monocytes: 10 %
Neutrophils Absolute: 3.1 10*3/uL (ref 1.4–7.0)
Neutrophils: 50 %
Platelets: 235 10*3/uL (ref 150–379)
RBC: 4.98 x10E6/uL (ref 4.14–5.80)
RDW: 14.3 % (ref 12.3–15.4)
Retic Ct Pct: 1.2 % (ref 0.6–2.6)
Total Iron Binding Capacity: 311 ug/dL (ref 250–450)
UIBC: 175 ug/dL (ref 111–343)
Vitamin B-12: 636 pg/mL (ref 211–946)
WBC: 6.2 10*3/uL (ref 3.4–10.8)

## 2014-10-17 LAB — CMP14+EGFR
ALT: 91 IU/L — AB (ref 0–44)
AST: 43 IU/L — ABNORMAL HIGH (ref 0–40)
Albumin/Globulin Ratio: 2.3 (ref 1.1–2.5)
Albumin: 4.8 g/dL (ref 3.5–5.5)
Alkaline Phosphatase: 80 IU/L (ref 39–117)
BUN/Creatinine Ratio: 15 (ref 9–20)
BUN: 15 mg/dL (ref 6–24)
Bilirubin Total: 0.9 mg/dL (ref 0.0–1.2)
CALCIUM: 9.5 mg/dL (ref 8.7–10.2)
CO2: 26 mmol/L (ref 18–29)
CREATININE: 1 mg/dL (ref 0.76–1.27)
Chloride: 97 mmol/L (ref 97–108)
GFR calc Af Amer: 95 mL/min/{1.73_m2} (ref >59)
GFR calc non Af Amer: 82 mL/min/{1.73_m2} (ref >59)
Globulin, Total: 2.1 g/dL (ref 1.5–4.5)
Glucose: 97 mg/dL (ref 65–99)
Potassium: 3.8 mmol/L (ref 3.5–5.2)
Sodium: 140 mmol/L (ref 134–144)
Total Protein: 6.9 g/dL (ref 6.0–8.5)

## 2014-10-17 LAB — HEPATITIS C ANTIBODY: Hep C Virus Ab: <0.1 s/co ratio (ref 0.0–0.9)

## 2014-10-17 LAB — ARTHRITIS PANEL
SED RATE: 6 mm/h (ref 0–30)
Uric Acid: 6.9 mg/dL (ref 3.7–8.6)

## 2014-10-17 LAB — LIPID PANEL
CHOL/HDL RATIO: 3.6 ratio (ref 0.0–5.0)
Cholesterol, Total: 224 mg/dL — ABNORMAL HIGH (ref 100–199)
HDL: 63 mg/dL (ref >39)
LDL CALC: 114 mg/dL — AB (ref 0–99)
TRIGLYCERIDES: 235 mg/dL — AB (ref 0–149)
VLDL Cholesterol Cal: 47 mg/dL — ABNORMAL HIGH (ref 5–40)

## 2014-10-17 LAB — THYROID PANEL WITH TSH
FREE THYROXINE INDEX: 1.7 (ref 1.2–4.9)
T3 Uptake Ratio: 27 % (ref 24–39)
T4, Total: 6.2 ug/dL (ref 4.5–12.0)
TSH: 1.64 u[IU]/mL (ref 0.450–4.500)

## 2014-10-17 LAB — VITAMIN D 25 HYDROXY (VIT D DEFICIENCY, FRACTURES): Vit D, 25-Hydroxy: 33.3 ng/mL (ref 30.0–100.0)

## 2014-10-20 ENCOUNTER — Other Ambulatory Visit: Payer: Self-pay | Admitting: Family

## 2014-10-20 MED ORDER — ATORVASTATIN CALCIUM 40 MG PO TABS: 40.0000 mg | ORAL_TABLET | Freq: Every day | ORAL | Status: DC

## 2014-10-21 ENCOUNTER — Telehealth: Payer: Self-pay | Admitting: Family

## 2014-10-21 NOTE — Telephone Encounter (Signed)
Patient aware of results.

## 2014-11-09 ENCOUNTER — Telehealth: Payer: Self-pay | Admitting: Family

## 2014-11-12 ENCOUNTER — Telehealth: Payer: Self-pay | Admitting: Family

## 2014-11-12 DIAGNOSIS — G8929 Other chronic pain: Secondary | ICD-10-CM

## 2014-11-12 DIAGNOSIS — M542 Cervicalgia: Principal | ICD-10-CM

## 2014-11-12 MED ORDER — HYDROCODONE-ACETAMINOPHEN 7.5-325 MG PO TABS: 1.0000 | ORAL_TABLET | Freq: Four times a day (QID) | ORAL | Status: DC | PRN

## 2014-11-12 NOTE — Telephone Encounter (Signed)
Michael Branch patient, last seen and filled 10/16/14. Rx will print

## 2014-11-12 NOTE — Telephone Encounter (Signed)
Left voicemail telling patient prescription is ready for pick up

## 2014-11-12 NOTE — Telephone Encounter (Signed)
We'll provide refill. Caryl Pina, MD [facility] 11/12/2014, 12:42 PM

## 2014-12-09 ENCOUNTER — Other Ambulatory Visit: Payer: Self-pay | Admitting: Family

## 2014-12-09 DIAGNOSIS — M542 Cervicalgia: Principal | ICD-10-CM

## 2014-12-09 DIAGNOSIS — G8929 Other chronic pain: Secondary | ICD-10-CM

## 2014-12-09 NOTE — Telephone Encounter (Signed)
Patient of Alyse Low. Last seen on 8/26. Please advise. If approved please print and route to [facility]

## 2014-12-09 NOTE — Telephone Encounter (Signed)
Please advise 

## 2014-12-09 NOTE — Telephone Encounter (Signed)
This can wait until tomorrow when she is here. Caryl Pina, MD [facility] 12/09/2014, 1:47 PM

## 2014-12-10 MED ORDER — HYDROCODONE-ACETAMINOPHEN 7.5-325 MG PO TABS: 1.0000 | ORAL_TABLET | Freq: Four times a day (QID) | ORAL | Status: DC | PRN

## 2014-12-10 NOTE — Telephone Encounter (Signed)
RX ready for pick up 

## 2014-12-10 NOTE — Telephone Encounter (Signed)
Left message stating rx up front ready for pickup.

## 2015-01-11 ENCOUNTER — Other Ambulatory Visit: Payer: Self-pay | Admitting: Family

## 2015-01-11 DIAGNOSIS — M542 Cervicalgia: Principal | ICD-10-CM

## 2015-01-11 DIAGNOSIS — G8929 Other chronic pain: Secondary | ICD-10-CM

## 2015-01-11 MED ORDER — HYDROCODONE-ACETAMINOPHEN 7.5-325 MG PO TABS: 1.0000 | ORAL_TABLET | Freq: Four times a day (QID) | ORAL | Status: DC | PRN

## 2015-01-11 NOTE — Telephone Encounter (Signed)
Last filled 12/10/14, last seen 10/16/14.

## 2015-01-11 NOTE — Telephone Encounter (Signed)
RX ready for pick up 

## 2015-01-11 NOTE — Telephone Encounter (Signed)
Hydrocodone script ready.Patient aware.

## 2015-01-18 ENCOUNTER — Ambulatory Visit: Payer: Medicare Other | Admitting: Family

## 2015-01-21 ENCOUNTER — Encounter: Payer: Self-pay | Admitting: Family

## 2015-01-21 ENCOUNTER — Ambulatory Visit (INDEPENDENT_AMBULATORY_CARE_PROVIDER_SITE_OTHER): Payer: Medicare Other | Admitting: Family

## 2015-01-21 VITALS — BP 122/85 | HR 93 | Temp 97.1°F | Ht 64.5 in | Wt 175.0 lb

## 2015-01-21 DIAGNOSIS — I10 Essential (primary) hypertension: Secondary | ICD-10-CM

## 2015-01-21 DIAGNOSIS — G8929 Other chronic pain: Secondary | ICD-10-CM | POA: Diagnosis not present

## 2015-01-21 DIAGNOSIS — E785 Hyperlipidemia, unspecified: Secondary | ICD-10-CM | POA: Diagnosis not present

## 2015-01-21 DIAGNOSIS — E559 Vitamin D deficiency, unspecified: Secondary | ICD-10-CM | POA: Diagnosis not present

## 2015-01-21 DIAGNOSIS — Z125 Encounter for screening for malignant neoplasm of prostate: Secondary | ICD-10-CM | POA: Diagnosis not present

## 2015-01-21 DIAGNOSIS — Z23 Encounter for immunization: Secondary | ICD-10-CM | POA: Diagnosis not present

## 2015-01-21 DIAGNOSIS — G4700 Insomnia, unspecified: Secondary | ICD-10-CM | POA: Diagnosis not present

## 2015-01-21 DIAGNOSIS — M542 Cervicalgia: Secondary | ICD-10-CM

## 2015-01-21 MED ORDER — AMITRIPTYLINE HCL 75 MG PO TABS: 75.0000 mg | ORAL_TABLET | Freq: Every day | ORAL | Status: DC

## 2015-01-21 NOTE — Patient Instructions (Signed)

## 2015-01-21 NOTE — Progress Notes (Signed)
Subjective:    Patient ID: Michael Branch, male    DOB: [birth year], 59 y.o.   MRN: [id number]  Pt presents to the office today for chronic follow up.  Pt states he is having arm pain. PT states he helped a neighbor to cut a tree down and since then his arms have been "hurting". Pt states the pain is constant on a 8 out 10.  Pt states he had neck surgery in 2002 and states he has nerve damage. Pt states he has seen a rheumatologists, neurologists, and pain clinic in the past with no relief.  Hypertension This is a chronic problem. The current episode started more than 1 year ago. The problem has been resolved since onset. The problem is controlled. Associated symptoms include headaches. Pertinent negatives include no anxiety, malaise/fatigue, palpitations, peripheral edema or shortness of breath. Risk factors for coronary artery disease include dyslipidemia and male gender. Past treatments include calcium channel blockers and diuretics. The current treatment provides mild improvement. There is no history of kidney disease, CAD/MI, CVA, heart failure or a thyroid problem. There is no history of sleep apnea.  Hyperlipidemia This is a chronic problem. The current episode started more than 1 year ago. The problem is uncontrolled. Recent lipid tests were reviewed and are high. He has no history of diabetes. Pertinent negatives include no leg pain or shortness of breath. Current antihyperlipidemic treatment includes diet change and statins. The current treatment provides mild improvement of lipids. Risk factors for coronary artery disease include dyslipidemia, hypertension, male sex, obesity and a sedentary lifestyle.  Insomnia Pt currently taking amitriptyline 75 mcg at bedtime every night. PT states it helps a lot. No complaints at this time.    Review of Systems  Constitutional: Negative.  Negative for malaise/fatigue.  Respiratory: Negative.  Negative for shortness of breath.   Cardiovascular:  Negative.  Negative for palpitations.  Gastrointestinal: Negative.   Endocrine: Negative.   Genitourinary: Negative.   Musculoskeletal: Negative.   Neurological: Positive for headaches.  Hematological: Negative.   Psychiatric/Behavioral: Negative.   All other systems reviewed and are negative.      Objective:   Physical Exam  Constitutional: He is oriented to person, place, and time. He appears well-developed and well-nourished. No distress.  HENT:  Head: Normocephalic.  Right Ear: External ear normal.  Left Ear: External ear normal.  Nose: Nose normal.  Mouth/Throat: Oropharynx is clear and moist.  Eyes: Pupils are equal, round, and reactive to light. Right eye exhibits no discharge. Left eye exhibits no discharge.  Neck: Normal range of motion. Neck supple. No thyromegaly present.  Cardiovascular: Normal rate, regular rhythm, normal heart sounds and intact distal pulses.   No murmur heard. Pulmonary/Chest: Effort normal and breath sounds normal. No respiratory distress. He has no wheezes.  Abdominal: Soft. Bowel sounds are normal. He exhibits no distension. There is no tenderness.  Musculoskeletal: Normal range of motion. He exhibits no edema or tenderness.  Neurological: He is alert and oriented to person, place, and time. He has normal reflexes. No cranial nerve deficit.  Skin: Skin is warm and dry. No rash noted. No erythema.  Psychiatric: He has a normal mood and affect. His behavior is normal. Judgment and thought content normal.  Vitals reviewed.     BP 122/85 mmHg  Pulse 93  Temp(Src) 97.1 F (36.2 C) (Oral)  Ht 5' 4.5" (1.638 m)  Wt 175 lb (79.379 kg)  BMI 29.59 kg/m2     Assessment & Plan:  1.  Encounter for immunization  2. Essential hypertension - CMP14+EGFR  3. Chronic neck pain - CMP14+EGFR - amitriptyline (ELAVIL) 75 MG tablet; Take 1 tablet (75 mg total) by mouth at bedtime.  Dispense: 90 tablet; Refill: 3  4. Hyperlipidemia - CMP14+EGFR -  Lipid panel  5. Insomnia - CMP14+EGFR  6. Vitamin D deficiency - CMP14+EGFR - VITAMIN D 25 Hydroxy (Vit-D Deficiency, Fractures)  7. Prostate cancer screening - CMP14+EGFR - PSA, total and free   Continue all meds Labs pending Health Maintenance reviewed-Flu vaccine Diet and exercise encouraged RTO 6 months  Evelina Dun, FNP

## 2015-01-22 LAB — CMP14+EGFR
ALBUMIN: 4.5 g/dL (ref 3.5–5.5)
ALT: 67 IU/L — ABNORMAL HIGH (ref 0–44)
AST: 35 IU/L (ref 0–40)
Albumin/Globulin Ratio: 2.3 (ref 1.1–2.5)
Alkaline Phosphatase: 96 IU/L (ref 39–117)
BUN / CREAT RATIO: 14 (ref 9–20)
BUN: 16 mg/dL (ref 6–24)
Bilirubin Total: 0.6 mg/dL (ref 0.0–1.2)
CALCIUM: 9.9 mg/dL (ref 8.7–10.2)
CO2: 31 mmol/L — AB (ref 18–29)
CREATININE: 1.15 mg/dL (ref 0.76–1.27)
Chloride: 98 mmol/L (ref 97–106)
GFR, EST AFRICAN AMERICAN: 80 mL/min/{1.73_m2} (ref >59)
GFR, EST NON AFRICAN AMERICAN: 69 mL/min/{1.73_m2} (ref >59)
GLOBULIN, TOTAL: 2 g/dL (ref 1.5–4.5)
Glucose: 102 mg/dL — ABNORMAL HIGH (ref 65–99)
Potassium: 4.4 mmol/L (ref 3.5–5.2)
SODIUM: 143 mmol/L (ref 136–144)
TOTAL PROTEIN: 6.5 g/dL (ref 6.0–8.5)

## 2015-01-22 LAB — LIPID PANEL
CHOL/HDL RATIO: 2.4 ratio (ref 0.0–5.0)
Cholesterol, Total: 164 mg/dL (ref 100–199)
HDL: 68 mg/dL (ref >39)
LDL CALC: 65 mg/dL (ref 0–99)
Triglycerides: 153 mg/dL — ABNORMAL HIGH (ref 0–149)
VLDL Cholesterol Cal: 31 mg/dL (ref 5–40)

## 2015-01-22 LAB — PSA, TOTAL AND FREE
PROSTATE SPECIFIC AG, SERUM: 1.1 ng/mL (ref 0.0–4.0)
PSA FREE PCT: 11.8 %
PSA FREE: 0.13 ng/mL

## 2015-01-22 LAB — VITAMIN D 25 HYDROXY (VIT D DEFICIENCY, FRACTURES): VIT D 25 HYDROXY: 37.6 ng/mL (ref 30.0–100.0)

## 2015-01-25 ENCOUNTER — Telehealth: Payer: Self-pay | Admitting: Family

## 2015-01-25 NOTE — Telephone Encounter (Signed)
Reviewed pt test results.

## 2015-02-08 ENCOUNTER — Other Ambulatory Visit: Payer: Self-pay | Admitting: Family

## 2015-02-08 DIAGNOSIS — M542 Cervicalgia: Principal | ICD-10-CM

## 2015-02-08 DIAGNOSIS — G8929 Other chronic pain: Secondary | ICD-10-CM

## 2015-02-09 MED ORDER — HYDROCODONE-ACETAMINOPHEN 7.5-325 MG PO TABS: 1.0000 | ORAL_TABLET | Freq: Four times a day (QID) | ORAL | Status: DC | PRN

## 2015-02-09 NOTE — Telephone Encounter (Signed)
Aware, script for pain medication is ready. 

## 2015-02-09 NOTE — Telephone Encounter (Signed)
RX ready for pick up 

## 2015-03-11 ENCOUNTER — Telehealth: Payer: Self-pay | Admitting: Family

## 2015-03-11 DIAGNOSIS — G8929 Other chronic pain: Secondary | ICD-10-CM

## 2015-03-11 DIAGNOSIS — M542 Cervicalgia: Principal | ICD-10-CM

## 2015-03-11 MED ORDER — HYDROCODONE-ACETAMINOPHEN 7.5-325 MG PO TABS: 1.0000 | ORAL_TABLET | Freq: Four times a day (QID) | ORAL | Status: DC | PRN

## 2015-03-11 NOTE — Telephone Encounter (Signed)
Patient aware rx is ready to be picked up 

## 2015-03-11 NOTE — Telephone Encounter (Signed)
RX ready for pick up 

## 2015-04-12 ENCOUNTER — Other Ambulatory Visit: Payer: Self-pay | Admitting: Family

## 2015-04-12 DIAGNOSIS — M542 Cervicalgia: Principal | ICD-10-CM

## 2015-04-12 DIAGNOSIS — G8929 Other chronic pain: Secondary | ICD-10-CM

## 2015-04-12 MED ORDER — HYDROCODONE-ACETAMINOPHEN 7.5-325 MG PO TABS: 1.0000 | ORAL_TABLET | Freq: Four times a day (QID) | ORAL | Status: DC | PRN

## 2015-04-12 MED ORDER — ONDANSETRON HCL 4 MG PO TABS: 4.0000 mg | ORAL_TABLET | Freq: Three times a day (TID) | ORAL | Status: DC | PRN

## 2015-04-12 NOTE — Telephone Encounter (Signed)
RX ready for pick up 

## 2015-04-12 NOTE — Telephone Encounter (Signed)
Prescription sent to pharmacy.

## 2015-04-12 NOTE — Telephone Encounter (Signed)
Patient aware,script for nausea medication was sent to pharmacy.

## 2015-04-12 NOTE — Telephone Encounter (Signed)
Last filled 03/11/15, last seen 01/21/15. Rx will print

## 2015-04-12 NOTE — Telephone Encounter (Signed)
Patient aware that rx is ready to be picked up.  

## 2015-05-03 ENCOUNTER — Ambulatory Visit (INDEPENDENT_AMBULATORY_CARE_PROVIDER_SITE_OTHER): Payer: Medicare Other | Admitting: Family

## 2015-05-03 ENCOUNTER — Encounter: Payer: Self-pay | Admitting: Family

## 2015-05-03 VITALS — BP 128/90 | HR 110 | Temp 97.5°F | Ht 64.5 in | Wt 172.8 lb

## 2015-05-03 DIAGNOSIS — F1120 Opioid dependence, uncomplicated: Secondary | ICD-10-CM | POA: Diagnosis not present

## 2015-05-03 DIAGNOSIS — Z5189 Encounter for other specified aftercare: Secondary | ICD-10-CM | POA: Diagnosis not present

## 2015-05-03 DIAGNOSIS — Z0289 Encounter for other administrative examinations: Secondary | ICD-10-CM

## 2015-05-03 DIAGNOSIS — Z79891 Long term (current) use of opiate analgesic: Secondary | ICD-10-CM | POA: Diagnosis not present

## 2015-05-03 DIAGNOSIS — M542 Cervicalgia: Secondary | ICD-10-CM

## 2015-05-03 DIAGNOSIS — M25511 Pain in right shoulder: Secondary | ICD-10-CM

## 2015-05-03 DIAGNOSIS — Z79899 Other long term (current) drug therapy: Secondary | ICD-10-CM

## 2015-05-03 DIAGNOSIS — M545 Low back pain, unspecified: Secondary | ICD-10-CM | POA: Insufficient documentation

## 2015-05-03 DIAGNOSIS — R52 Pain, unspecified: Secondary | ICD-10-CM

## 2015-05-03 DIAGNOSIS — M1990 Unspecified osteoarthritis, unspecified site: Secondary | ICD-10-CM | POA: Diagnosis not present

## 2015-05-03 DIAGNOSIS — G8929 Other chronic pain: Secondary | ICD-10-CM | POA: Insufficient documentation

## 2015-05-03 MED ORDER — HYDROCODONE-ACETAMINOPHEN 7.5-325 MG PO TABS: 1.0000 | ORAL_TABLET | Freq: Four times a day (QID) | ORAL | Status: DC | PRN

## 2015-05-03 MED ORDER — BUPIVACAINE HCL 0.25 % IJ SOLN
1.0000 mL | Freq: Once | INTRAMUSCULAR | Status: AC
  Administered 2015-05-03: 1 mL via INTRA_ARTICULAR

## 2015-05-03 MED ORDER — METHYLPREDNISOLONE ACETATE 40 MG/ML IJ SUSP
40.0000 mg | Freq: Once | INTRAMUSCULAR | Status: AC
  Administered 2015-05-03: 40 mg via INTRA_ARTICULAR

## 2015-05-03 NOTE — Progress Notes (Signed)
Subjective:    Patient ID: Michael Branch, male    DOB: Sep 20, 1955, 60 y.o.   MRN: [id number]  HPI [facility] Controlled Substance Abuse database reviewed- Yes  Depression screen [facility] 2/9 05/03/2015 01/21/2015 10/16/2014 04/17/2014 12/08/2013  Decreased Interest 1 0 3 0 0  Down, Depressed, Hopeless 1 0 2 0 0  PHQ - 2 Score 2 0 5 0 0  Altered sleeping - - 2 - -  Tired, decreased energy - - 3 - -  Change in appetite - - 0 - -  Feeling bad or failure about yourself  - - 1 - -  Trouble concentrating - - 2 - -  Moving slowly or fidgety/restless - - 0 - -  Suicidal thoughts - - 0 - -  PHQ-9 Score - - 13 - -    GAD 7 : Generalized Anxiety Score 05/03/2015  Nervous, Anxious, on Edge 1  Control/stop worrying 1  Worry too much - different things 1  Trouble relaxing 1  Restless 1  Easily annoyed or irritable 1  Afraid - awful might happen 0  Total GAD 7 Score 6  Anxiety Difficulty Very difficult       [facility] drug screen performed- Yes  SOAPP  0= never  1= seldom  2=sometimes  3= often  4= very often  How often do you have mood swings? 0 How often do you smoke a cigarette within an hour after waling up? 2 How often have you taken medication other than the way that it was prescribed?0 How often have you used illegal drugs in the past 5 years? 0 How often, in your lifetime, have you had legal problems or been arrested? 0  Score 0  Alcohol Audit - How often during the last year have found that you: 0-Never   1- Less than monthly   2- Monthly     3-Weekly     4-daily or almost daily  - found that you were not able to stop drinking once you started- 0 -failed to do what was normally expected of you because of drinking- 0 -needed a first drink in the morning- 0 -had a feeling of guilt or remorse after drinking- 0 -are/were unable to remember what happened the night before because of your drinking- 0  0- NO   2- yes but not in last year  4- yes during last year -Have you  or someone else been injured because of your drinking- 0 - Has anyone been concerned about your drinking or suggested you cut down- 0        TOTAL- 0   ( 0-7- alcohol education, 8-15- simple advice, 16-19 simple advice plus counseling, 20-40 referral for evaluation and treatment 0   Designated Pharmacy- [facility] [place]  Pain assessment: Cause of pain- Chronic neck, chronic back, and carpal tunnel Pain on scale of 1-10- 8-10 Frequency- constant What increases pain- Walking and using hands What makes pain Better-Rest and pain medication  Pain management agreement reviewed and signed- Yes     Review of Systems  Constitutional: Negative.   HENT: Negative.   Respiratory: Negative.   Cardiovascular: Negative.   Gastrointestinal: Negative.   Endocrine: Negative.   Genitourinary: Negative.   Musculoskeletal: Negative.   Neurological: Negative.   Hematological: Negative.   Psychiatric/Behavioral: Negative.   All other systems reviewed and are negative.      Objective:   Physical Exam  Constitutional: He is oriented to person, place, and time. He appears  well-developed and well-nourished. No distress.  HENT:  Head: Normocephalic.  Eyes: Pupils are equal, round, and reactive to light. Right eye exhibits no discharge. Left eye exhibits no discharge.  Neck: Normal range of motion. Neck supple. No thyromegaly present.  Cardiovascular: Normal rate, regular rhythm, normal heart sounds and intact distal pulses.   No murmur heard. Pulmonary/Chest: Effort normal and breath sounds normal. No respiratory distress. He has no wheezes.  Abdominal: Soft. Bowel sounds are normal. He exhibits no distension. There is no tenderness.  Musculoskeletal: He exhibits no edema or tenderness.  Limited ROM of right shoulder pain when rotating related to pain  Neurological: He is alert and oriented to person, place, and time.  Skin: Skin is warm and dry. No rash noted. No erythema.  Psychiatric: He has a  normal mood and affect. His behavior is normal. Judgment and thought content normal.  Vitals reviewed.   BP 128/90 mmHg  Pulse 110  Temp(Src) 97.5 F (36.4 C) (Oral)  Ht 5' 4.5" (1.638 m)  Wt 172 lb 12.8 oz (78.382 kg)  BMI 29.21 kg/m2  rightshoulder prepped with betadine Injected with Marcaine .5% plain and methylprednisolone with 22 guage needle. Patient tolerated well.      Assessment & Plan:  1. Pain management - ToxASSURE Select 13 (MW), Urine  2. Chronic neck pain - HYDROcodone-acetaminophen (NORCO) 7.5-325 MG tablet; Take 1 tablet by mouth every 6 (six) hours as needed for moderate pain.  Dispense: 90 tablet; Refill: 0 - HYDROcodone-acetaminophen (NORCO) 7.5-325 MG tablet; Take 1 tablet by mouth every 6 (six) hours as needed for moderate pain.  Dispense: 90 tablet; Refill: 0 - HYDROcodone-acetaminophen (NORCO) 7.5-325 MG tablet; Take 1 tablet by mouth every 6 (six) hours as needed for moderate pain.  Dispense: 90 tablet; Refill: 0  3. Chronic low back pain - HYDROcodone-acetaminophen (NORCO) 7.5-325 MG tablet; Take 1 tablet by mouth every 6 (six) hours as needed for moderate pain.  Dispense: 90 tablet; Refill: 0 - HYDROcodone-acetaminophen (NORCO) 7.5-325 MG tablet; Take 1 tablet by mouth every 6 (six) hours as needed for moderate pain.  Dispense: 90 tablet; Refill: 0 - HYDROcodone-acetaminophen (NORCO) 7.5-325 MG tablet; Take 1 tablet by mouth every 6 (six) hours as needed for moderate pain.  Dispense: 90 tablet; Refill: 0  4. Pain medication agreement signed - HYDROcodone-acetaminophen (NORCO) 7.5-325 MG tablet; Take 1 tablet by mouth every 6 (six) hours as needed for moderate pain.  Dispense: 90 tablet; Refill: 0 - HYDROcodone-acetaminophen (NORCO) 7.5-325 MG tablet; Take 1 tablet by mouth every 6 (six) hours as needed for moderate pain.  Dispense: 90 tablet; Refill: 0 - HYDROcodone-acetaminophen (NORCO) 7.5-325 MG tablet; Take 1 tablet by mouth every 6 (six) hours as  needed for moderate pain.  Dispense: 90 tablet; Refill: 0  5. Uncomplicated opioid dependence ([place]) - HYDROcodone-acetaminophen (NORCO) 7.5-325 MG tablet; Take 1 tablet by mouth every 6 (six) hours as needed for moderate pain.  Dispense: 90 tablet; Refill: 0 - HYDROcodone-acetaminophen (NORCO) 7.5-325 MG tablet; Take 1 tablet by mouth every 6 (six) hours as needed for moderate pain.  Dispense: 90 tablet; Refill: 0 - HYDROcodone-acetaminophen (NORCO) 7.5-325 MG tablet; Take 1 tablet by mouth every 6 (six) hours as needed for moderate pain.  Dispense: 90 tablet; Refill: 0  6. Arthritis  7. Right shoulder pain - bupivacaine (MARCAINE) 0.25 % (with pres) injection 1 mL; Inject 1 mL into the articular space once. - methylPREDNISolone acetate (DEPO-MEDROL) injection 40 mg; Inject 1 mL (40 mg total) into  the articular space once.   Continue all meds Labs pending Health Maintenance reviewed Diet and exercise encouraged RTO 3 months  Evelina Dun, FNP

## 2015-05-03 NOTE — Patient Instructions (Signed)

## 2015-05-07 LAB — TOXASSURE SELECT 13 (MW), URINE: PDF: 0

## 2015-05-09 ENCOUNTER — Other Ambulatory Visit: Payer: Self-pay | Admitting: Family

## 2015-06-08 ENCOUNTER — Other Ambulatory Visit: Payer: Self-pay | Admitting: Family

## 2015-07-15 ENCOUNTER — Other Ambulatory Visit: Payer: Self-pay | Admitting: Family

## 2015-07-22 ENCOUNTER — Ambulatory Visit: Payer: Medicare Other | Admitting: Family

## 2015-07-29 ENCOUNTER — Encounter: Payer: Self-pay | Admitting: Family

## 2015-07-29 ENCOUNTER — Ambulatory Visit (INDEPENDENT_AMBULATORY_CARE_PROVIDER_SITE_OTHER): Payer: Medicare Other | Admitting: Family

## 2015-07-29 VITALS — BP 137/90 | HR 85 | Temp 97.4°F | Ht 64.5 in | Wt 174.2 lb

## 2015-07-29 DIAGNOSIS — E8881 Metabolic syndrome: Secondary | ICD-10-CM | POA: Insufficient documentation

## 2015-07-29 DIAGNOSIS — M545 Low back pain, unspecified: Secondary | ICD-10-CM

## 2015-07-29 DIAGNOSIS — Z0289 Encounter for other administrative examinations: Secondary | ICD-10-CM

## 2015-07-29 DIAGNOSIS — E559 Vitamin D deficiency, unspecified: Secondary | ICD-10-CM

## 2015-07-29 DIAGNOSIS — G8929 Other chronic pain: Secondary | ICD-10-CM | POA: Diagnosis not present

## 2015-07-29 DIAGNOSIS — M542 Cervicalgia: Secondary | ICD-10-CM

## 2015-07-29 DIAGNOSIS — E785 Hyperlipidemia, unspecified: Secondary | ICD-10-CM | POA: Diagnosis not present

## 2015-07-29 DIAGNOSIS — E663 Overweight: Secondary | ICD-10-CM

## 2015-07-29 DIAGNOSIS — Z79899 Other long term (current) drug therapy: Secondary | ICD-10-CM | POA: Diagnosis not present

## 2015-07-29 DIAGNOSIS — F1120 Opioid dependence, uncomplicated: Secondary | ICD-10-CM

## 2015-07-29 DIAGNOSIS — G4700 Insomnia, unspecified: Secondary | ICD-10-CM | POA: Diagnosis not present

## 2015-07-29 DIAGNOSIS — I10 Essential (primary) hypertension: Secondary | ICD-10-CM | POA: Diagnosis not present

## 2015-07-29 DIAGNOSIS — M1990 Unspecified osteoarthritis, unspecified site: Secondary | ICD-10-CM

## 2015-07-29 DIAGNOSIS — Z114 Encounter for screening for human immunodeficiency virus [HIV]: Secondary | ICD-10-CM | POA: Diagnosis not present

## 2015-07-29 MED ORDER — HYDROCODONE-ACETAMINOPHEN 7.5-325 MG PO TABS: 1.0000 | ORAL_TABLET | Freq: Four times a day (QID) | ORAL | Status: DC | PRN

## 2015-07-29 MED ORDER — AMLODIPINE BESYLATE 5 MG PO TABS: 5.0000 mg | ORAL_TABLET | Freq: Every day | ORAL | Status: DC

## 2015-07-29 MED ORDER — CYCLOBENZAPRINE HCL 10 MG PO TABS: ORAL_TABLET | ORAL | Status: DC

## 2015-07-29 MED ORDER — ALBUTEROL SULFATE HFA 108 (90 BASE) MCG/ACT IN AERS: INHALATION_SPRAY | RESPIRATORY_TRACT | Status: DC

## 2015-07-29 MED ORDER — ATORVASTATIN CALCIUM 40 MG PO TABS: 40.0000 mg | ORAL_TABLET | Freq: Every day | ORAL | Status: DC

## 2015-07-29 MED ORDER — HYDROCHLOROTHIAZIDE 25 MG PO TABS: ORAL_TABLET | ORAL | Status: DC

## 2015-07-29 NOTE — Progress Notes (Signed)
Subjective:    Patient ID: Michael Branch, male    DOB: 04/14/1955, 60 y.o.   MRN: [id number]  Pt presents to the office today for chronic follow up.  Hypertension This is a chronic problem. The current episode started more than 1 year ago. The problem has been resolved since onset. The problem is controlled. Associated symptoms include headaches. Pertinent negatives include no anxiety, malaise/fatigue, palpitations, peripheral edema or shortness of breath. Risk factors for coronary artery disease include dyslipidemia, male gender, obesity and family history. Past treatments include calcium channel blockers and diuretics. The current treatment provides mild improvement. There is no history of kidney disease, CAD/MI, CVA, heart failure or a thyroid problem. There is no history of sleep apnea.  Hyperlipidemia This is a chronic problem. The current episode started more than 1 year ago. The problem is controlled. Recent lipid tests were reviewed and are normal. He has no history of diabetes. Pertinent negatives include no leg pain or shortness of breath. Current antihyperlipidemic treatment includes diet change and statins. The current treatment provides mild improvement of lipids. Risk factors for coronary artery disease include dyslipidemia, hypertension, male sex, obesity and a sedentary lifestyle.  Insomnia Primary symptoms: difficulty falling asleep, frequent awakening, no malaise/fatigue.  The current episode started more than one year. The onset quality is gradual. The problem has been waxing and waning since onset. The symptoms are aggravated by pain. Past treatments include meditation. The treatment provided moderate relief.  Arthritis Presents for follow-up visit. The disease course has been fluctuating. He complains of pain. Affected locations include the neck and right shoulder. His pain is at a severity of 8/10. Associated symptoms include pain while resting. Pertinent negatives include no  dysuria. Past treatments include acetaminophen, NSAIDs, rest and an opioid. The treatment provided moderate relief.      Review of Systems  Constitutional: Negative.  Negative for malaise/fatigue.  Respiratory: Negative.  Negative for shortness of breath.   Cardiovascular: Negative.  Negative for palpitations.  Gastrointestinal: Negative.   Endocrine: Negative.   Genitourinary: Negative.  Negative for dysuria.  Musculoskeletal: Positive for arthritis.  Neurological: Positive for headaches.  Hematological: Negative.   Psychiatric/Behavioral: The patient has insomnia.   All other systems reviewed and are negative.      Objective:   Physical Exam  Constitutional: He is oriented to person, place, and time. He appears well-developed and well-nourished. No distress.  HENT:  Head: Normocephalic.  Right Ear: External ear normal.  Left Ear: External ear normal.  Nose: Nose normal.  Mouth/Throat: Oropharynx is clear and moist.  Eyes: Pupils are equal, round, and reactive to light. Right eye exhibits no discharge. Left eye exhibits no discharge.  Neck: Normal range of motion. Neck supple. No thyromegaly present.  Cardiovascular: Normal rate, regular rhythm, normal heart sounds and intact distal pulses.   No murmur heard. Pulmonary/Chest: Effort normal and breath sounds normal. No respiratory distress. He has no wheezes.  Abdominal: Soft. Bowel sounds are normal. He exhibits no distension. There is no tenderness.  Musculoskeletal: Normal range of motion. He exhibits no edema or tenderness.  Neurological: He is alert and oriented to person, place, and time. He has normal reflexes. No cranial nerve deficit.  Skin: Skin is warm and dry. No rash noted. No erythema.  Psychiatric: He has a normal mood and affect. His behavior is normal. Judgment and thought content normal.  Vitals reviewed.     BP 137/90 mmHg  Pulse 85  Temp(Src) 97.4 F (36.3 C) (  Oral)  Ht 5' 4.5" (1.638 m)  Wt 174 lb  3.2 oz (79.017 kg)  BMI 29.45 kg/m2     Assessment & Plan:  1. Essential hypertension - hydrochlorothiazide (HYDRODIURIL) 25 MG tablet; TAKE 1 TABLET (25 MG TOTAL) BY MOUTH DAILY.  Dispense: 90 tablet; Refill: 2 - amLODipine (NORVASC) 5 MG tablet; Take 1 tablet (5 mg total) by mouth daily.  Dispense: 90 tablet; Refill: 3 - CMP14+EGFR  2. Arthritis - HYDROcodone-acetaminophen (NORCO) 7.5-325 MG tablet; Take 1 tablet by mouth every 6 (six) hours as needed for moderate pain.  Dispense: 90 tablet; Refill: 0 - HYDROcodone-acetaminophen (NORCO) 7.5-325 MG tablet; Take 1 tablet by mouth every 6 (six) hours as needed for moderate pain.  Dispense: 90 tablet; Refill: 0 - HYDROcodone-acetaminophen (NORCO) 7.5-325 MG tablet; Take 1 tablet by mouth every 6 (six) hours as needed for moderate pain.  Dispense: 90 tablet; Refill: 0 - cyclobenzaprine (FLEXERIL) 10 MG tablet; TAKE 1 TABLET (10 MG TOTAL) BY MOUTH 3 (THREE) TIMES DAILY AS NEEDED FOR MUSCLE SPASMS.  Dispense: 90 tablet; Refill: 1 - CMP14+EGFR  3. Chronic low back pain - HYDROcodone-acetaminophen (NORCO) 7.5-325 MG tablet; Take 1 tablet by mouth every 6 (six) hours as needed for moderate pain.  Dispense: 90 tablet; Refill: 0 - HYDROcodone-acetaminophen (NORCO) 7.5-325 MG tablet; Take 1 tablet by mouth every 6 (six) hours as needed for moderate pain.  Dispense: 90 tablet; Refill: 0 - HYDROcodone-acetaminophen (NORCO) 7.5-325 MG tablet; Take 1 tablet by mouth every 6 (six) hours as needed for moderate pain.  Dispense: 90 tablet; Refill: 0 - cyclobenzaprine (FLEXERIL) 10 MG tablet; TAKE 1 TABLET (10 MG TOTAL) BY MOUTH 3 (THREE) TIMES DAILY AS NEEDED FOR MUSCLE SPASMS.  Dispense: 90 tablet; Refill: 1 - CMP14+EGFR  4. Chronic neck pain - HYDROcodone-acetaminophen (NORCO) 7.5-325 MG tablet; Take 1 tablet by mouth every 6 (six) hours as needed for moderate pain.  Dispense: 90 tablet; Refill: 0 - HYDROcodone-acetaminophen (NORCO) 7.5-325 MG tablet; Take  1 tablet by mouth every 6 (six) hours as needed for moderate pain.  Dispense: 90 tablet; Refill: 0 - HYDROcodone-acetaminophen (NORCO) 7.5-325 MG tablet; Take 1 tablet by mouth every 6 (six) hours as needed for moderate pain.  Dispense: 90 tablet; Refill: 0 - cyclobenzaprine (FLEXERIL) 10 MG tablet; TAKE 1 TABLET (10 MG TOTAL) BY MOUTH 3 (THREE) TIMES DAILY AS NEEDED FOR MUSCLE SPASMS.  Dispense: 90 tablet; Refill: 1 - CMP14+EGFR  5. Hyperlipidemia - atorvastatin (LIPITOR) 40 MG tablet; Take 1 tablet (40 mg total) by mouth daily.  Dispense: 90 tablet; Refill: 3 - CMP14+EGFR - Lipid panel  6. Insomnia - CMP14+EGFR  7. Uncomplicated opioid dependence ([place] - HYDROcodone-acetaminophen (NORCO) 7.5-325 MG tablet; Take 1 tablet by mouth every 6 (six) hours as needed for moderate pain.  Dispense: 90 tablet; Refill: 0 - HYDROcodone-acetaminophen (NORCO) 7.5-325 MG tablet; Take 1 tablet by mouth every 6 (six) hours as needed for moderate pain.  Dispense: 90 tablet; Refill: 0 - HYDROcodone-acetaminophen (NORCO) 7.5-325 MG tablet; Take 1 tablet by mouth every 6 (six) hours as needed for moderate pain.  Dispense: 90 tablet; Refill: 0 - CMP14+EGFR  8. Pain medication agreement signed - HYDROcodone-acetaminophen (NORCO) 7.5-325 MG tablet; Take 1 tablet by mouth every 6 (six) hours as needed for moderate pain.  Dispense: 90 tablet; Refill: 0 - HYDROcodone-acetaminophen (NORCO) 7.5-325 MG tablet; Take 1 tablet by mouth every 6 (six) hours as needed for moderate pain.  Dispense: 90 tablet; Refill: 0 - HYDROcodone-acetaminophen (NORCO) 7.5-325 MG  tablet; Take 1 tablet by mouth every 6 (six) hours as needed for moderate pain.  Dispense: 90 tablet; Refill: 0 - CMP14+EGFR  9. Vitamin D deficiency - CMP14+EGFR - VITAMIN D 25 Hydroxy (Vit-D Deficiency, Fractures)  10. Overweight (BMI 25.0-29.9) - CMP14+EGFR  11. Metabolic syndrome - EGB15+VVOH  14. Screening for HIV (human immunodeficiency virus) - HIV  antibody   Continue all meds Labs pending Health Maintenance reviewed Diet and exercise encouraged RTO 3 months  Evelina Dun, FNP

## 2015-07-29 NOTE — Patient Instructions (Signed)
Sciatica With Rehab The sciatic nerve runs from the back down the leg and is responsible for sensation and control of the muscles in the back (posterior) side of the thigh, lower leg, and foot. Sciatica is a condition that is characterized by inflammation of this nerve.  SYMPTOMS   Signs of nerve damage, including numbness and/or weakness along the posterior side of the lower extremity.  Pain in the back of the thigh that may also travel down the leg.  Pain that worsens when sitting for long periods of time.  Occasionally, pain in the back or buttock. CAUSES  Inflammation of the sciatic nerve is the cause of sciatica. The inflammation is due to something irritating the nerve. Common sources of irritation include:  Sitting for long periods of time.  Direct trauma to the nerve.  Arthritis of the spine.  Herniated or ruptured disk.  Slipping of the vertebrae (spondylolisthesis).  Pressure from soft tissues, such as muscles or ligament-like tissue (fascia). RISK INCREASES WITH:  Sports that place pressure or stress on the spine (football or weightlifting).  Poor strength and flexibility.  Failure to warm up properly before activity.  Family history of low back pain or disk disorders.  Previous back injury or surgery.  Poor body mechanics, especially when lifting, or poor posture. PREVENTION   Warm up and stretch properly before activity.  Maintain physical fitness:  Strength, flexibility, and endurance.  Cardiovascular fitness.  Learn and use proper technique, especially with posture and lifting. When possible, have coach correct improper technique.  Avoid activities that place stress on the spine. PROGNOSIS If treated properly, then sciatica usually resolves within 6 weeks. However, occasionally surgery is necessary.  RELATED COMPLICATIONS   Permanent nerve damage, including pain, numbness, tingle, or weakness.  Chronic back pain.  Risks of surgery: infection,  bleeding, nerve damage, or damage to surrounding tissues. TREATMENT Treatment initially involves resting from any activities that aggravate your symptoms. The use of ice and medication may help reduce pain and inflammation. The use of strengthening and stretching exercises may help reduce pain with activity. These exercises may be performed at home or with referral to a therapist. A therapist may recommend further treatments, such as transcutaneous electronic nerve stimulation (TENS) or ultrasound. Your caregiver may recommend corticosteroid injections to help reduce inflammation of the sciatic nerve. If symptoms persist despite non-surgical (conservative) treatment, then surgery may be recommended. MEDICATION  If pain medication is necessary, then nonsteroidal anti-inflammatory medications, such as aspirin and ibuprofen, or other minor pain relievers, such as acetaminophen, are often recommended.  Do not take pain medication for 7 days before surgery.  Prescription pain relievers may be given if deemed necessary by your caregiver. Use only as directed and only as much as you need.  Ointments applied to the skin may be helpful.  Corticosteroid injections may be given by your caregiver. These injections should be reserved for the most serious cases, because they may only be given a certain number of times. HEAT AND COLD  Cold treatment (icing) relieves pain and reduces inflammation. Cold treatment should be applied for 10 to 15 minutes every 2 to 3 hours for inflammation and pain and immediately after any activity that aggravates your symptoms. Use ice packs or massage the area with a piece of ice (ice massage).  Heat treatment may be used prior to performing the stretching and strengthening activities prescribed by your caregiver, physical therapist, or athletic trainer. Use a heat pack or soak the injury in warm water.   SEEK MEDICAL CARE IF:  Treatment seems to offer no benefit, or the condition  worsens.  Any medications produce adverse side effects. EXERCISES  RANGE OF MOTION (ROM) AND STRETCHING EXERCISES - Sciatica Most people with sciatic will find that their symptoms worsen with either excessive bending forward (flexion) or arching at the low back (extension). The exercises which will help resolve your symptoms will focus on the opposite motion. Your physician, physical therapist or athletic trainer will help you determine which exercises will be most helpful to resolve your low back pain. Do not complete any exercises without first consulting with your clinician. Discontinue any exercises which worsen your symptoms until you speak to your clinician. If you have pain, numbness or tingling which travels down into your buttocks, leg or foot, the goal of the therapy is for these symptoms to move closer to your back and eventually resolve. Occasionally, these leg symptoms will get better, but your low back pain may worsen; this is typically an indication of progress in your rehabilitation. Be certain to be very alert to any changes in your symptoms and the activities in which you participated in the 24 hours prior to the change. Sharing this information with your clinician will allow him/her to most efficiently treat your condition. These exercises may help you when beginning to rehabilitate your injury. Your symptoms may resolve with or without further involvement from your physician, physical therapist or athletic trainer. While completing these exercises, remember:   Restoring tissue flexibility helps normal motion to return to the joints. This allows healthier, less painful movement and activity.  An effective stretch should be held for at least 30 seconds.  A stretch should never be painful. You should only feel a gentle lengthening or release in the stretched tissue. FLEXION RANGE OF MOTION AND STRETCHING EXERCISES: STRETCH - Flexion, Single Knee to Chest   Lie on a firm bed or floor  with both legs extended in front of you.  Keeping one leg in contact with the floor, bring your opposite knee to your chest. Hold your leg in place by either grabbing behind your thigh or at your knee.  Pull until you feel a gentle stretch in your low back. Hold __________ seconds.  Slowly release your grasp and repeat the exercise with the opposite side. Repeat __________ times. Complete this exercise __________ times per day.  STRETCH - Flexion, Double Knee to Chest  Lie on a firm bed or floor with both legs extended in front of you.  Keeping one leg in contact with the floor, bring your opposite knee to your chest.  Tense your stomach muscles to support your back and then lift your other knee to your chest. Hold your legs in place by either grabbing behind your thighs or at your knees.  Pull both knees toward your chest until you feel a gentle stretch in your low back. Hold __________ seconds.  Tense your stomach muscles and slowly return one leg at a time to the floor. Repeat __________ times. Complete this exercise __________ times per day.  STRETCH - Low Trunk Rotation   Lie on a firm bed or floor. Keeping your legs in front of you, bend your knees so they are both pointed toward the ceiling and your feet are flat on the floor.  Extend your arms out to the side. This will stabilize your upper body by keeping your shoulders in contact with the floor.  Gently and slowly drop both knees together to one side until   you feel a gentle stretch in your low back. Hold for __________ seconds.  Tense your stomach muscles to support your low back as you bring your knees back to the starting position. Repeat the exercise to the other side. Repeat __________ times. Complete this exercise __________ times per day  EXTENSION RANGE OF MOTION AND FLEXIBILITY EXERCISES: STRETCH - Extension, Prone on Elbows  Lie on your stomach on the floor, a bed will be too soft. Place your palms about shoulder  width apart and at the height of your head.  Place your elbows under your shoulders. If this is too painful, stack pillows under your chest.  Allow your body to relax so that your hips drop lower and make contact more completely with the floor.  Hold this position for __________ seconds.  Slowly return to lying flat on the floor. Repeat __________ times. Complete this exercise __________ times per day.  RANGE OF MOTION - Extension, Prone Press Ups  Lie on your stomach on the floor, a bed will be too soft. Place your palms about shoulder width apart and at the height of your head.  Keeping your back as relaxed as possible, slowly straighten your elbows while keeping your hips on the floor. You may adjust the placement of your hands to maximize your comfort. As you gain motion, your hands will come more underneath your shoulders.  Hold this position __________ seconds.  Slowly return to lying flat on the floor. Repeat __________ times. Complete this exercise __________ times per day.  STRENGTHENING EXERCISES - Sciatica  These exercises may help you when beginning to rehabilitate your injury. These exercises should be done near your "sweet spot." This is the neutral, low-back arch, somewhere between fully rounded and fully arched, that is your least painful position. When performed in this safe range of motion, these exercises can be used for people who have either a flexion or extension based injury. These exercises may resolve your symptoms with or without further involvement from your physician, physical therapist or athletic trainer. While completing these exercises, remember:   Muscles can gain both the endurance and the strength needed for everyday activities through controlled exercises.  Complete these exercises as instructed by your physician, physical therapist or athletic trainer. Progress with the resistance and repetition exercises only as your caregiver advises.  You may  experience muscle soreness or fatigue, but the pain or discomfort you are trying to eliminate should never worsen during these exercises. If this pain does worsen, stop and make certain you are following the directions exactly. If the pain is still present after adjustments, discontinue the exercise until you can discuss the trouble with your clinician. STRENGTHENING - Deep Abdominals, Pelvic Tilt   Lie on a firm bed or floor. Keeping your legs in front of you, bend your knees so they are both pointed toward the ceiling and your feet are flat on the floor.  Tense your lower abdominal muscles to press your low back into the floor. This motion will rotate your pelvis so that your tail bone is scooping upwards rather than pointing at your feet or into the floor.  With a gentle tension and even breathing, hold this position for __________ seconds. Repeat __________ times. Complete this exercise __________ times per day.  STRENGTHENING - Abdominals, Crunches   Lie on a firm bed or floor. Keeping your legs in front of you, bend your knees so they are both pointed toward the ceiling and your feet are flat on the   floor. Cross your arms over your chest.  Slightly tip your chin down without bending your neck.  Tense your abdominals and slowly lift your trunk high enough to just clear your shoulder blades. Lifting higher can put excessive stress on the low back and does not further strengthen your abdominal muscles.  Control your return to the starting position. Repeat __________ times. Complete this exercise __________ times per day.  STRENGTHENING - Quadruped, Opposite UE/LE Lift  Assume a hands and knees position on a firm surface. Keep your hands under your shoulders and your knees under your hips. You may place padding under your knees for comfort.  Find your neutral spine and gently tense your abdominal muscles so that you can maintain this position. Your shoulders and hips should form a rectangle  that is parallel with the floor and is not twisted.  Keeping your trunk steady, lift your right hand no higher than your shoulder and then your left leg no higher than your hip. Make sure you are not holding your breath. Hold this position __________ seconds.  Continuing to keep your abdominal muscles tense and your back steady, slowly return to your starting position. Repeat with the opposite arm and leg. Repeat __________ times. Complete this exercise __________ times per day.  STRENGTHENING - Abdominals and Quadriceps, Straight Leg Raise   Lie on a firm bed or floor with both legs extended in front of you.  Keeping one leg in contact with the floor, bend the other knee so that your foot can rest flat on the floor.  Find your neutral spine, and tense your abdominal muscles to maintain your spinal position throughout the exercise.  Slowly lift your straight leg off the floor about 6 inches for a count of 15, making sure to not hold your breath.  Still keeping your neutral spine, slowly lower your leg all the way to the floor. Repeat this exercise with each leg __________ times. Complete this exercise __________ times per day. POSTURE AND BODY MECHANICS CONSIDERATIONS - Sciatica Keeping correct posture when sitting, standing or completing your activities will reduce the stress put on different body tissues, allowing injured tissues a chance to heal and limiting painful experiences. The following are general guidelines for improved posture. Your physician or physical therapist will provide you with any instructions specific to your needs. While reading these guidelines, remember:  The exercises prescribed by your provider will help you have the flexibility and strength to maintain correct postures.  The correct posture provides the optimal environment for your joints to work. All of your joints have less wear and tear when properly supported by a spine with good posture. This means you will  experience a healthier, less painful body.  Correct posture must be practiced with all of your activities, especially prolonged sitting and standing. Correct posture is as important when doing repetitive low-stress activities (typing) as it is when doing a single heavy-load activity (lifting). RESTING POSITIONS Consider which positions are most painful for you when choosing a resting position. If you have pain with flexion-based activities (sitting, bending, stooping, squatting), choose a position that allows you to rest in a less flexed posture. You would want to avoid curling into a fetal position on your side. If your pain worsens with extension-based activities (prolonged standing, working overhead), avoid resting in an extended position such as sleeping on your stomach. Most people will find more comfort when they rest with their spine in a more neutral position, neither too rounded nor too   arched. Lying on a non-sagging bed on your side with a pillow between your knees, or on your back with a pillow under your knees will often provide some relief. Keep in mind, being in any one position for a prolonged period of time, no matter how correct your posture, can still lead to stiffness. PROPER SITTING POSTURE In order to minimize stress and discomfort on your spine, you must sit with correct posture Sitting with good posture should be effortless for a healthy body. Returning to good posture is a gradual process. Many people can work toward this most comfortably by using various supports until they have the flexibility and strength to maintain this posture on their own. When sitting with proper posture, your ears will fall over your shoulders and your shoulders will fall over your hips. You should use the back of the chair to support your upper back. Your low back will be in a neutral position, just slightly arched. You may place a small pillow or folded towel at the base of your low back for support.  When  working at a desk, create an environment that supports good, upright posture. Without extra support, muscles fatigue and lead to excessive strain on joints and other tissues. Keep these recommendations in mind: CHAIR:   A chair should be able to slide under your desk when your back makes contact with the back of the chair. This allows you to work closely.  The chair's height should allow your eyes to be level with the upper part of your monitor and your hands to be slightly lower than your elbows. BODY POSITION  Your feet should make contact with the floor. If this is not possible, use a foot rest.  Keep your ears over your shoulders. This will reduce stress on your neck and low back. INCORRECT SITTING POSTURES   If you are feeling tired and unable to assume a healthy sitting posture, do not slouch or slump. This puts excessive strain on your back tissues, causing more damage and pain. Healthier options include:  Using more support, like a lumbar pillow.  Switching tasks to something that requires you to be upright or walking.  Talking a brief walk.  Lying down to rest in a neutral-spine position. PROLONGED STANDING WHILE SLIGHTLY LEANING FORWARD  When completing a task that requires you to lean forward while standing in one place for a long time, place either foot up on a stationary 2-4 inch high object to help maintain the best posture. When both feet are on the ground, the low back tends to lose its slight inward curve. If this curve flattens (or becomes too large), then the back and your other joints will experience too much stress, fatigue more quickly and can cause pain.  CORRECT STANDING POSTURES Proper standing posture should be assumed with all daily activities, even if they only take a few moments, like when brushing your teeth. As in sitting, your ears should fall over your shoulders and your shoulders should fall over your hips. You should keep a slight tension in your abdominal  muscles to brace your spine. Your tailbone should point down to the ground, not behind your body, resulting in an over-extended swayback posture.  INCORRECT STANDING POSTURES  Common incorrect standing postures include a forward head, locked knees and/or an excessive swayback. WALKING Walk with an upright posture. Your ears, shoulders and hips should all line-up. PROLONGED ACTIVITY IN A FLEXED POSITION When completing a task that requires you to bend forward   at your waist or lean over a low surface, try to find a way to stabilize 3 of 4 of your limbs. You can place a hand or elbow on your thigh or rest a knee on the surface you are reaching across. This will provide you more stability so that your muscles do not fatigue as quickly. By keeping your knees relaxed, or slightly bent, you will also reduce stress across your low back. CORRECT LIFTING TECHNIQUES DO :   Assume a wide stance. This will provide you more stability and the opportunity to get as close as possible to the object which you are lifting.  Tense your abdominals to brace your spine; then bend at the knees and hips. Keeping your back locked in a neutral-spine position, lift using your leg muscles. Lift with your legs, keeping your back straight.  Test the weight of unknown objects before attempting to lift them.  Try to keep your elbows locked down at your sides in order get the best strength from your shoulders when carrying an object.  Always ask for help when lifting heavy or awkward objects. INCORRECT LIFTING TECHNIQUES DO NOT:   Lock your knees when lifting, even if it is a small object.  Bend and twist. Pivot at your feet or move your feet when needing to change directions.  Assume that you cannot safely pick up a paperclip without proper posture.   This information is not intended to replace advice given to you by your health care provider. Make sure you discuss any questions you have with your health care provider.     Document Released: 02/06/2005 Document Revised: 06/23/2014 Document Reviewed: 05/21/2008 [employer] Interactive Patient Education 2016 [employer]  

## 2015-07-30 LAB — LIPID PANEL
Chol/HDL Ratio: 2.3 ratio units (ref 0.0–5.0)
Cholesterol, Total: 163 mg/dL (ref 100–199)
HDL: 70 mg/dL (ref >39)
LDL CALC: 56 mg/dL (ref 0–99)
Triglycerides: 186 mg/dL — ABNORMAL HIGH (ref 0–149)
VLDL CHOLESTEROL CAL: 37 mg/dL (ref 5–40)

## 2015-07-30 LAB — CMP14+EGFR
ALBUMIN: 4.6 g/dL (ref 3.6–4.8)
ALK PHOS: 95 IU/L (ref 39–117)
ALT: 74 IU/L — ABNORMAL HIGH (ref 0–44)
AST: 40 IU/L (ref 0–40)
Albumin/Globulin Ratio: 2.2 (ref 1.2–2.2)
BILIRUBIN TOTAL: 0.8 mg/dL (ref 0.0–1.2)
BUN / CREAT RATIO: 12 (ref 10–24)
BUN: 13 mg/dL (ref 8–27)
CHLORIDE: 98 mmol/L (ref 96–106)
CO2: 25 mmol/L (ref 18–29)
Calcium: 10 mg/dL (ref 8.6–10.2)
Creatinine, Ser: 1.12 mg/dL (ref 0.76–1.27)
GFR calc non Af Amer: 71 mL/min/{1.73_m2} (ref >59)
GFR, EST AFRICAN AMERICAN: 82 mL/min/{1.73_m2} (ref >59)
GLUCOSE: 81 mg/dL (ref 65–99)
Globulin, Total: 2.1 g/dL (ref 1.5–4.5)
Potassium: 4 mmol/L (ref 3.5–5.2)
SODIUM: 141 mmol/L (ref 134–144)
Total Protein: 6.7 g/dL (ref 6.0–8.5)

## 2015-07-30 LAB — VITAMIN D 25 HYDROXY (VIT D DEFICIENCY, FRACTURES): VIT D 25 HYDROXY: 40.7 ng/mL (ref 30.0–100.0)

## 2015-07-30 LAB — HIV ANTIBODY (ROUTINE TESTING W REFLEX): HIV SCREEN 4TH GENERATION: NONREACTIVE

## 2015-09-08 NOTE — Telephone Encounter (Signed)
done

## 2015-10-29 ENCOUNTER — Ambulatory Visit (INDEPENDENT_AMBULATORY_CARE_PROVIDER_SITE_OTHER): Payer: Medicare Other | Admitting: Family

## 2015-10-29 ENCOUNTER — Encounter: Payer: Self-pay | Admitting: Family

## 2015-10-29 VITALS — BP 126/85 | HR 80 | Temp 97.3°F | Ht 64.5 in | Wt 172.4 lb

## 2015-10-29 DIAGNOSIS — M545 Low back pain, unspecified: Secondary | ICD-10-CM

## 2015-10-29 DIAGNOSIS — F1120 Opioid dependence, uncomplicated: Secondary | ICD-10-CM

## 2015-10-29 DIAGNOSIS — M542 Cervicalgia: Secondary | ICD-10-CM

## 2015-10-29 DIAGNOSIS — G8929 Other chronic pain: Secondary | ICD-10-CM

## 2015-10-29 DIAGNOSIS — Z0289 Encounter for other administrative examinations: Secondary | ICD-10-CM

## 2015-10-29 DIAGNOSIS — I10 Essential (primary) hypertension: Secondary | ICD-10-CM

## 2015-10-29 DIAGNOSIS — Z79899 Other long term (current) drug therapy: Secondary | ICD-10-CM

## 2015-10-29 DIAGNOSIS — E785 Hyperlipidemia, unspecified: Secondary | ICD-10-CM | POA: Diagnosis not present

## 2015-10-29 DIAGNOSIS — F32A Depression, unspecified: Secondary | ICD-10-CM

## 2015-10-29 DIAGNOSIS — G4700 Insomnia, unspecified: Secondary | ICD-10-CM | POA: Diagnosis not present

## 2015-10-29 DIAGNOSIS — E559 Vitamin D deficiency, unspecified: Secondary | ICD-10-CM

## 2015-10-29 DIAGNOSIS — E8881 Metabolic syndrome: Secondary | ICD-10-CM

## 2015-10-29 DIAGNOSIS — E663 Overweight: Secondary | ICD-10-CM | POA: Diagnosis not present

## 2015-10-29 DIAGNOSIS — F329 Major depressive disorder, single episode, unspecified: Secondary | ICD-10-CM

## 2015-10-29 DIAGNOSIS — L255 Unspecified contact dermatitis due to plants, except food: Secondary | ICD-10-CM

## 2015-10-29 DIAGNOSIS — M1990 Unspecified osteoarthritis, unspecified site: Secondary | ICD-10-CM

## 2015-10-29 MED ORDER — HYDROCODONE-ACETAMINOPHEN 7.5-325 MG PO TABS: 1.0000 | ORAL_TABLET | Freq: Four times a day (QID) | ORAL | 0 refills | Status: DC | PRN

## 2015-10-29 MED ORDER — ESCITALOPRAM OXALATE 5 MG PO TABS: 5.0000 mg | ORAL_TABLET | Freq: Every day | ORAL | 3 refills | Status: DC

## 2015-10-29 MED ORDER — AMLODIPINE BESYLATE 5 MG PO TABS: 5.0000 mg | ORAL_TABLET | Freq: Every day | ORAL | 3 refills | Status: DC

## 2015-10-29 MED ORDER — ATORVASTATIN CALCIUM 40 MG PO TABS: 40.0000 mg | ORAL_TABLET | Freq: Every day | ORAL | 3 refills | Status: DC

## 2015-10-29 MED ORDER — HYDROCHLOROTHIAZIDE 25 MG PO TABS: ORAL_TABLET | ORAL | 2 refills | Status: DC

## 2015-10-29 MED ORDER — TRIAMCINOLONE ACETONIDE 0.5 % EX OINT: 1.0000 "application " | TOPICAL_OINTMENT | Freq: Two times a day (BID) | CUTANEOUS | 0 refills | Status: DC

## 2015-10-29 MED ORDER — ONDANSETRON HCL 4 MG PO TABS: 4.0000 mg | ORAL_TABLET | Freq: Three times a day (TID) | ORAL | 1 refills | Status: DC | PRN

## 2015-10-29 MED ORDER — AMITRIPTYLINE HCL 75 MG PO TABS: 75.0000 mg | ORAL_TABLET | Freq: Every day | ORAL | 3 refills | Status: DC

## 2015-10-29 NOTE — Patient Instructions (Signed)

## 2015-10-29 NOTE — Progress Notes (Signed)
Subjective:    Patient ID: Michael Branch, male    DOB: Jun 06, 1955, 60 y.o.   MRN: [id number]  Pt presents to the office today for annual physical and chronic follow up.  Medication Refill  Associated symptoms include arthralgias, headaches, neck pain and a rash.  Hypertension  This is a chronic problem. The current episode started more than 1 year ago. The problem has been resolved since onset. The problem is controlled. Associated symptoms include headaches, malaise/fatigue and neck pain. Pertinent negatives include no anxiety, palpitations, peripheral edema or shortness of breath. Risk factors for coronary artery disease include dyslipidemia, male gender, obesity and family history. Past treatments include calcium channel blockers and diuretics. The current treatment provides mild improvement. There is no history of kidney disease, CAD/MI, CVA, heart failure or a thyroid problem. There is no history of sleep apnea.  Hyperlipidemia  This is a chronic problem. The current episode started more than 1 year ago. The problem is controlled. Recent lipid tests were reviewed and are normal. He has no history of diabetes. Pertinent negatives include no leg pain or shortness of breath. Current antihyperlipidemic treatment includes diet change and statins. The current treatment provides mild improvement of lipids. Risk factors for coronary artery disease include dyslipidemia, hypertension, male sex, obesity and a sedentary lifestyle.  Insomnia  Primary symptoms: difficulty falling asleep, frequent awakening, malaise/fatigue.  The current episode started more than one year. The onset quality is gradual. The problem has been waxing and waning since onset. The symptoms are aggravated by pain. Past treatments include meditation. The treatment provided moderate relief. PMH includes: depression.  Arthritis  Presents for follow-up visit. The disease course has been fluctuating. He complains of pain. Affected  locations include the neck and right shoulder. His pain is at a severity of 8/10. Associated symptoms include pain while resting and rash. Pertinent negatives include no dysuria. Past treatments include acetaminophen, NSAIDs, rest and an opioid. The treatment provided moderate relief.  Rash  This is a chronic problem. The current episode started 1 to 4 weeks ago. The problem has been waxing and waning since onset. The affected locations include the right ankle, left ankle and right arm. The rash is characterized by redness and itchiness. He was exposed to plant contact. Pertinent negatives include no shortness of breath. Past treatments include anti-itch cream. The treatment provided mild relief.  Depression         This is a chronic problem.  The current episode started more than 1 year ago.   The onset quality is gradual.   The problem occurs intermittently.  The problem has been waxing and waning since onset.  Associated symptoms include helplessness, insomnia, headaches and sad.  Associated symptoms include no hopelessness, no decreased interest and no suicidal ideas.  Past treatments include nothing.  Previous treatment provided no relief relief.  Past medical history includes chronic pain.     Pertinent negatives include no thyroid problem and no anxiety.     Review of Systems  Constitutional: Positive for malaise/fatigue.  Respiratory: Negative.  Negative for shortness of breath.   Cardiovascular: Negative.  Negative for palpitations.  Gastrointestinal: Negative.   Endocrine: Negative.   Genitourinary: Negative.  Negative for dysuria.  Musculoskeletal: Positive for arthralgias, arthritis, back pain, neck pain and neck stiffness.  Skin: Positive for rash.  Neurological: Positive for headaches.  Hematological: Negative.   Psychiatric/Behavioral: Positive for depression. Negative for suicidal ideas. The patient has insomnia.   All other systems reviewed and  are negative.      Objective:     Physical Exam  Constitutional: He is oriented to person, place, and time. He appears well-developed and well-nourished. No distress.  HENT:  Head: Normocephalic.  Right Ear: External ear normal.  Left Ear: External ear normal.  Nose: Nose normal.  Mouth/Throat: Oropharynx is clear and moist.  Eyes: Pupils are equal, round, and reactive to light. Right eye exhibits no discharge. Left eye exhibits no discharge.  Neck: Normal range of motion. Neck supple. No thyromegaly present.  Cardiovascular: Normal rate, regular rhythm, normal heart sounds and intact distal pulses.   No murmur heard. Pulmonary/Chest: Effort normal and breath sounds normal. No respiratory distress. He has no wheezes.  Abdominal: Soft. Bowel sounds are normal. He exhibits no distension. There is no tenderness.  Musculoskeletal: Normal range of motion. He exhibits tenderness. He exhibits no edema.  Decreased ROM of neck with rotation 90 degrees  Neurological: He is alert and oriented to person, place, and time. He has normal reflexes. No cranial nerve deficit.  Skin: Skin is warm and dry. Rash (generalized drying erythemas on right ankle and right wrist) noted. No erythema.  Psychiatric: He has a normal mood and affect. His behavior is normal. Judgment and thought content normal.  Vitals reviewed.    BP 126/85   Pulse 80   Temp 97.3 F (36.3 C) (Oral)   Ht 5' 4.5" (1.638 m)   Wt 172 lb 6.4 oz (78.2 kg)   BMI 29.14 kg/m      Assessment & Plan:  1. Essential hypertension - CMP14+EGFR - amLODipine (NORVASC) 5 MG tablet; Take 1 tablet (5 mg total) by mouth daily.  Dispense: 90 tablet; Refill: 3 - hydrochlorothiazide (HYDRODIURIL) 25 MG tablet; TAKE 1 TABLET (25 MG TOTAL) BY MOUTH DAILY.  Dispense: 90 tablet; Refill: 2  2. Arthritis - CMP14+EGFR - HYDROcodone-acetaminophen (NORCO) 7.5-325 MG tablet; Take 1 tablet by mouth every 6 (six) hours as needed for moderate pain.  Dispense: 90 tablet; Refill: 0 -  HYDROcodone-acetaminophen (NORCO) 7.5-325 MG tablet; Take 1 tablet by mouth every 6 (six) hours as needed for moderate pain.  Dispense: 90 tablet; Refill: 0 - HYDROcodone-acetaminophen (NORCO) 7.5-325 MG tablet; Take 1 tablet by mouth every 6 (six) hours as needed for moderate pain.  Dispense: 90 tablet; Refill: 0  3. Chronic low back pain - CMP14+EGFR - HYDROcodone-acetaminophen (NORCO) 7.5-325 MG tablet; Take 1 tablet by mouth every 6 (six) hours as needed for moderate pain.  Dispense: 90 tablet; Refill: 0 - HYDROcodone-acetaminophen (NORCO) 7.5-325 MG tablet; Take 1 tablet by mouth every 6 (six) hours as needed for moderate pain.  Dispense: 90 tablet; Refill: 0 - HYDROcodone-acetaminophen (NORCO) 7.5-325 MG tablet; Take 1 tablet by mouth every 6 (six) hours as needed for moderate pain.  Dispense: 90 tablet; Refill: 0  4. Chronic neck pain - CMP14+EGFR - HYDROcodone-acetaminophen (NORCO) 7.5-325 MG tablet; Take 1 tablet by mouth every 6 (six) hours as needed for moderate pain.  Dispense: 90 tablet; Refill: 0 - HYDROcodone-acetaminophen (NORCO) 7.5-325 MG tablet; Take 1 tablet by mouth every 6 (six) hours as needed for moderate pain.  Dispense: 90 tablet; Refill: 0 - HYDROcodone-acetaminophen (NORCO) 7.5-325 MG tablet; Take 1 tablet by mouth every 6 (six) hours as needed for moderate pain.  Dispense: 90 tablet; Refill: 0 - amitriptyline (ELAVIL) 75 MG tablet; Take 1 tablet (75 mg total) by mouth at bedtime.  Dispense: 90 tablet; Refill: 3  5. Hyperlipidemia - CMP14+EGFR - Lipid panel -  atorvastatin (LIPITOR) 40 MG tablet; Take 1 tablet (40 mg total) by mouth daily.  Dispense: 90 tablet; Refill: 3  6. Insomnia - CMP14+EGFR - amitriptyline (ELAVIL) 75 MG tablet; Take 1 tablet (75 mg total) by mouth at bedtime.  Dispense: 90 tablet; Refill: 3  7. Metabolic syndrome - BBW37+NGWL  8. Uncomplicated opioid dependence ([place]) - CMP14+EGFR - HYDROcodone-acetaminophen (NORCO) 7.5-325 MG tablet;  Take 1 tablet by mouth every 6 (six) hours as needed for moderate pain.  Dispense: 90 tablet; Refill: 0 - HYDROcodone-acetaminophen (NORCO) 7.5-325 MG tablet; Take 1 tablet by mouth every 6 (six) hours as needed for moderate pain.  Dispense: 90 tablet; Refill: 0 - HYDROcodone-acetaminophen (NORCO) 7.5-325 MG tablet; Take 1 tablet by mouth every 6 (six) hours as needed for moderate pain.  Dispense: 90 tablet; Refill: 0  9. Pain medication agreement signed - CMP14+EGFR - HYDROcodone-acetaminophen (NORCO) 7.5-325 MG tablet; Take 1 tablet by mouth every 6 (six) hours as needed for moderate pain.  Dispense: 90 tablet; Refill: 0 - HYDROcodone-acetaminophen (NORCO) 7.5-325 MG tablet; Take 1 tablet by mouth every 6 (six) hours as needed for moderate pain.  Dispense: 90 tablet; Refill: 0 - HYDROcodone-acetaminophen (NORCO) 7.5-325 MG tablet; Take 1 tablet by mouth every 6 (six) hours as needed for moderate pain.  Dispense: 90 tablet; Refill: 0  10. Overweight (BMI 25.0-29.9) - CMP14+EGFR  11. Vitamin D deficiency - CMP14+EGFR - VITAMIN D 25 Hydroxy (Vit-D Deficiency, Fractures)  12. Contact dermatitis due to plant - CMP14+EGFR - triamcinolone ointment (KENALOG) 0.5 %; Apply 1 application topically 2 (two) times daily.  Dispense: 30 g; Refill: 0  13. Depression -PT started on lexapro 5 mg today. PT states he has taken SSRI in past and has not tolerated. So we will try lower dose today - escitalopram (LEXAPRO) 5 MG tablet; Take 1 tablet (5 mg total) by mouth daily.  Dispense: 30 tablet; Refill: 3   Continue all meds Labs pending Health Maintenance reviewed Diet and exercise encouraged RTO 6 weeks for depression  Evelina Dun, FNP

## 2015-10-30 LAB — CMP14+EGFR
ALT: 53 IU/L — ABNORMAL HIGH (ref 0–44)
AST: 32 IU/L (ref 0–40)
Albumin/Globulin Ratio: 2.1 (ref 1.2–2.2)
Albumin: 4.9 g/dL — ABNORMAL HIGH (ref 3.6–4.8)
Alkaline Phosphatase: 94 IU/L (ref 39–117)
BUN/Creatinine Ratio: 12 (ref 10–24)
BUN: 13 mg/dL (ref 8–27)
Bilirubin Total: 1.1 mg/dL (ref 0.0–1.2)
CO2: 27 mmol/L (ref 18–29)
Calcium: 9.8 mg/dL (ref 8.6–10.2)
Chloride: 99 mmol/L (ref 96–106)
Creatinine, Ser: 1.13 mg/dL (ref 0.76–1.27)
GFR calc Af Amer: 81 mL/min/1.73 (ref >59)
GFR calc non Af Amer: 70 mL/min/1.73 (ref >59)
Globulin, Total: 2.3 g/dL (ref 1.5–4.5)
Glucose: 87 mg/dL (ref 65–99)
Potassium: 4.1 mmol/L (ref 3.5–5.2)
Sodium: 141 mmol/L (ref 134–144)
Total Protein: 7.2 g/dL (ref 6.0–8.5)

## 2015-10-30 LAB — LIPID PANEL
Chol/HDL Ratio: 2.6 ratio (ref 0.0–5.0)
Cholesterol, Total: 161 mg/dL (ref 100–199)
HDL: 63 mg/dL (ref >39)
LDL Calculated: 65 mg/dL (ref 0–99)
Triglycerides: 164 mg/dL — ABNORMAL HIGH (ref 0–149)
VLDL Cholesterol Cal: 33 mg/dL (ref 5–40)

## 2015-10-30 LAB — VITAMIN D 25 HYDROXY (VIT D DEFICIENCY, FRACTURES): Vit D, 25-Hydroxy: 47.5 ng/mL (ref 30.0–100.0)

## 2015-11-26 ENCOUNTER — Ambulatory Visit (INDEPENDENT_AMBULATORY_CARE_PROVIDER_SITE_OTHER): Payer: Medicare Other | Admitting: Family

## 2015-11-26 ENCOUNTER — Encounter: Payer: Self-pay | Admitting: Family

## 2015-11-26 VITALS — BP 119/85 | HR 104 | Temp 97.7°F | Ht 64.5 in | Wt 173.6 lb

## 2015-11-26 DIAGNOSIS — Z23 Encounter for immunization: Secondary | ICD-10-CM | POA: Diagnosis not present

## 2015-11-26 DIAGNOSIS — F329 Major depressive disorder, single episode, unspecified: Secondary | ICD-10-CM

## 2015-11-26 DIAGNOSIS — F32A Depression, unspecified: Secondary | ICD-10-CM

## 2015-11-26 MED ORDER — BUPROPION HCL ER (XL) 150 MG PO TB24: 150.0000 mg | ORAL_TABLET | Freq: Every day | ORAL | 1 refills | Status: DC

## 2015-11-26 MED ORDER — ONDANSETRON HCL 4 MG PO TABS: 4.0000 mg | ORAL_TABLET | Freq: Three times a day (TID) | ORAL | 2 refills | Status: DC | PRN

## 2015-11-26 NOTE — Patient Instructions (Signed)
Major Depressive Disorder Major depressive disorder is a mental illness. It also may be called clinical depression or unipolar depression. Major depressive disorder usually causes feelings of sadness, hopelessness, or helplessness. Some people with this disorder do not feel particularly sad but lose interest in doing things they used to enjoy (anhedonia). Major depressive disorder also can cause physical symptoms. It can interfere with work, school, relationships, and other normal everyday activities. The disorder varies in severity but is longer lasting and more serious than the sadness we all feel from time to time in our lives. Major depressive disorder often is triggered by stressful life events or major life changes. Examples of these triggers include divorce, loss of your job or home, a move, and the death of a family member or close friend. Sometimes this disorder occurs for no obvious reason at all. People who have family members with major depressive disorder or bipolar disorder are at higher risk for developing this disorder, with or without life stressors. Major depressive disorder can occur at any age. It may occur just once in your life (single episode major depressive disorder). It may occur multiple times (recurrent major depressive disorder). SYMPTOMS People with major depressive disorder have either anhedonia or depressed mood on nearly a daily basis for at least 2 weeks or longer. Symptoms of depressed mood include:  Feelings of sadness (blue or down in the dumps) or emptiness.  Feelings of hopelessness or helplessness.  Tearfulness or episodes of crying (may be observed by others).  Irritability (children and adolescents). In addition to depressed mood or anhedonia or both, people with this disorder have at least four of the following symptoms:  Difficulty sleeping or sleeping too much.   Significant change (increase or decrease) in appetite or weight.   Lack of energy or  motivation.  Feelings of guilt and worthlessness.   Difficulty concentrating, remembering, or making decisions.  Unusually slow movement (psychomotor retardation) or restlessness (as observed by others).   Recurrent wishes for death, recurrent thoughts of self-harm (suicide), or a suicide attempt. People with major depressive disorder commonly have persistent negative thoughts about themselves, other people, and the world. People with severe major depressive disorder may experiencedistorted beliefs or perceptions about the world (psychotic delusions). They also may see or hear things that are not real (psychotic hallucinations). DIAGNOSIS Major depressive disorder is diagnosed through an assessment by your health care provider. Your health care provider will ask aboutaspects of your daily life, such as mood,sleep, and appetite, to see if you have the diagnostic symptoms of major depressive disorder. Your health care provider may ask about your medical history and use of alcohol or drugs, including prescription medicines. Your health care provider also may do a physical exam and blood work. This is because certain medical conditions and the use of certain substances can cause major depressive disorder-like symptoms (secondary depression). Your health care provider also may refer you to a mental health specialist for further evaluation and treatment. TREATMENT It is important to recognize the symptoms of major depressive disorder and seek treatment. The following treatments can be prescribed for this disorder:   Medicine. Antidepressant medicines usually are prescribed. Antidepressant medicines are thought to correct chemical imbalances in the brain that are commonly associated with major depressive disorder. Other types of medicine may be added if the symptoms do not respond to antidepressant medicines alone or if psychotic delusions or hallucinations occur.  Talk therapy. Talk therapy can be  helpful in treating major depressive disorder by providing   support, education, and guidance. Certain types of talk therapy also can help with negative thinking (cognitive behavioral therapy) and with relationship issues that trigger this disorder (interpersonal therapy). A mental health specialist can help determine which treatment is best for you. Most people with major depressive disorder do well with a combination of medicine and talk therapy. Treatments involving electrical stimulation of the brain can be used in situations with extremely severe symptoms or when medicine and talk therapy do not work over time. These treatments include electroconvulsive therapy, transcranial magnetic stimulation, and vagal nerve stimulation.   This information is not intended to replace advice given to you by your health care provider. Make sure you discuss any questions you have with your health care provider.   Document Released: 06/03/2012 Document Revised: 02/27/2014 Document Reviewed: 06/03/2012 Elsevier Interactive Patient Education 2016 [employer]  

## 2015-11-26 NOTE — Progress Notes (Signed)
   Subjective:    Patient ID: Michael Branch, male    DOB: July 26, 1955, 60 y.o.   MRN: [id number]  Pt presents to the office today to recheck depression. Pt was seen one month ago and started on lexapo 5 mg. PT states since starting the medication he feels worse. Pt states he feels worthless and more sluggish.  Depression         This is a chronic problem.  The current episode started more than 1 year ago.   The onset quality is gradual.   The problem occurs constantly.  The problem has been waxing and waning since onset.  Associated symptoms include helplessness, decreased interest, body aches, myalgias and sad.  Associated symptoms include no hopelessness, not irritable and no suicidal ideas.  Past treatments include SSRIs - Selective serotonin reuptake inhibitors.  Previous treatment provided no relief relief.     Review of Systems  Musculoskeletal: Positive for arthralgias, back pain, joint swelling, myalgias and neck pain.  Psychiatric/Behavioral: Positive for depression. Negative for suicidal ideas. The patient is not nervous/anxious.   All other systems reviewed and are negative.      Objective:   Physical Exam  Constitutional: He is oriented to person, place, and time. He appears well-developed and well-nourished. He is not irritable. No distress.  HENT:  Head: Normocephalic.  Cardiovascular: Normal rate, regular rhythm, normal heart sounds and intact distal pulses.   No murmur heard. Pulmonary/Chest: Effort normal and breath sounds normal. No respiratory distress. He has no wheezes.  Abdominal: Soft. Bowel sounds are normal. He exhibits no distension. There is no tenderness.  Musculoskeletal: He exhibits tenderness.  Generalized tenderness in neck and lower back pain.   Neurological: He is alert and oriented to person, place, and time.  Skin: Skin is warm and dry. No rash noted. No erythema.  Psychiatric: He has a normal mood and affect. His behavior is normal. Judgment and  thought content normal.  Vitals reviewed.   BP 119/85   Pulse (!) 104   Temp 97.7 F (36.5 C) (Oral)   Ht 5' 4.5" (1.638 m)   Wt 173 lb 9.6 oz (78.7 kg)   BMI 29.34 kg/m        Assessment & Plan:  1. Depression, unspecified depression type -Will stop lexapro today and start Wellbutrin 150 mg today -Stress management discussed -RTO in 6 weeks to recheck depression  - buPROPion (WELLBUTRIN XL) 150 MG 24 hr tablet; Take 1 tablet (150 mg total) by mouth daily.  Dispense: 90 tablet; Refill: 1  2. Encounter for immunization - Flu Vaccine QUAD 36+ mos IM - ondansetron (ZOFRAN) 4 MG tablet; Take 1 tablet (4 mg total) by mouth every 8 (eight) hours as needed for nausea or vomiting.  Dispense: 30 tablet; Refill: Levan, FNP

## 2016-02-01 ENCOUNTER — Encounter: Payer: Self-pay | Admitting: Family

## 2016-02-01 ENCOUNTER — Ambulatory Visit (INDEPENDENT_AMBULATORY_CARE_PROVIDER_SITE_OTHER): Payer: Medicare Other

## 2016-02-01 ENCOUNTER — Ambulatory Visit (INDEPENDENT_AMBULATORY_CARE_PROVIDER_SITE_OTHER): Payer: Medicare Other | Admitting: Family

## 2016-02-01 VITALS — BP 125/84 | HR 104 | Temp 97.4°F | Ht 64.5 in | Wt 178.4 lb

## 2016-02-01 DIAGNOSIS — F329 Major depressive disorder, single episode, unspecified: Secondary | ICD-10-CM

## 2016-02-01 DIAGNOSIS — G4700 Insomnia, unspecified: Secondary | ICD-10-CM | POA: Diagnosis not present

## 2016-02-01 DIAGNOSIS — E559 Vitamin D deficiency, unspecified: Secondary | ICD-10-CM | POA: Diagnosis not present

## 2016-02-01 DIAGNOSIS — Z0289 Encounter for other administrative examinations: Secondary | ICD-10-CM

## 2016-02-01 DIAGNOSIS — E8881 Metabolic syndrome: Secondary | ICD-10-CM | POA: Diagnosis not present

## 2016-02-01 DIAGNOSIS — R0602 Shortness of breath: Secondary | ICD-10-CM | POA: Diagnosis not present

## 2016-02-01 DIAGNOSIS — M5440 Lumbago with sciatica, unspecified side: Secondary | ICD-10-CM

## 2016-02-01 DIAGNOSIS — Z87891 Personal history of nicotine dependence: Secondary | ICD-10-CM | POA: Diagnosis not present

## 2016-02-01 DIAGNOSIS — M1990 Unspecified osteoarthritis, unspecified site: Secondary | ICD-10-CM

## 2016-02-01 DIAGNOSIS — M542 Cervicalgia: Secondary | ICD-10-CM | POA: Diagnosis not present

## 2016-02-01 DIAGNOSIS — F1120 Opioid dependence, uncomplicated: Secondary | ICD-10-CM

## 2016-02-01 DIAGNOSIS — F32A Depression, unspecified: Secondary | ICD-10-CM

## 2016-02-01 DIAGNOSIS — E785 Hyperlipidemia, unspecified: Secondary | ICD-10-CM

## 2016-02-01 DIAGNOSIS — I10 Essential (primary) hypertension: Secondary | ICD-10-CM

## 2016-02-01 DIAGNOSIS — G8929 Other chronic pain: Secondary | ICD-10-CM

## 2016-02-01 DIAGNOSIS — E663 Overweight: Secondary | ICD-10-CM | POA: Diagnosis not present

## 2016-02-01 LAB — CMP14+EGFR
ALK PHOS: 90 IU/L (ref 39–117)
ALT: 100 IU/L — ABNORMAL HIGH (ref 0–44)
AST: 48 IU/L — AB (ref 0–40)
Albumin/Globulin Ratio: 2.2 (ref 1.2–2.2)
Albumin: 4.6 g/dL (ref 3.6–4.8)
BUN/Creatinine Ratio: 14 (ref 10–24)
BUN: 16 mg/dL (ref 8–27)
Bilirubin Total: 0.8 mg/dL (ref 0.0–1.2)
CO2: 25 mmol/L (ref 18–29)
CREATININE: 1.18 mg/dL (ref 0.76–1.27)
Calcium: 9.9 mg/dL (ref 8.6–10.2)
Chloride: 96 mmol/L (ref 96–106)
GFR calc Af Amer: 77 mL/min/{1.73_m2} (ref >59)
GFR calc non Af Amer: 67 mL/min/{1.73_m2} (ref >59)
GLOBULIN, TOTAL: 2.1 g/dL (ref 1.5–4.5)
GLUCOSE: 92 mg/dL (ref 65–99)
Potassium: 3.8 mmol/L (ref 3.5–5.2)
SODIUM: 138 mmol/L (ref 134–144)
Total Protein: 6.7 g/dL (ref 6.0–8.5)

## 2016-02-01 MED ORDER — HYDROCODONE-ACETAMINOPHEN 7.5-325 MG PO TABS: 1.0000 | ORAL_TABLET | Freq: Four times a day (QID) | ORAL | 0 refills | Status: DC | PRN

## 2016-02-01 MED ORDER — TRAZODONE HCL 50 MG PO TABS: 50.0000 mg | ORAL_TABLET | Freq: Every evening | ORAL | 3 refills | Status: DC | PRN

## 2016-02-01 NOTE — Patient Instructions (Signed)
Insomnia Insomnia is a sleep disorder that makes it difficult to fall asleep or to stay asleep. Insomnia can cause tiredness (fatigue), low energy, difficulty concentrating, mood swings, and poor performance at work or school. There are three different ways to classify insomnia:  Difficulty falling asleep.  Difficulty staying asleep.  Waking up too early in the morning. Any type of insomnia can be long-term (chronic) or short-term (acute). Both are common. Short-term insomnia usually lasts for three months or less. Chronic insomnia occurs at least three times a week for longer than three months. What are the causes? Insomnia may be caused by another condition, situation, or substance, such as:  Anxiety.  Certain medicines.  Gastroesophageal reflux disease (GERD) or other gastrointestinal conditions.  Asthma or other breathing conditions.  Restless legs syndrome, sleep apnea, or other sleep disorders.  Chronic pain.  Menopause. This may include hot flashes.  Stroke.  Abuse of alcohol, tobacco, or illegal drugs.  Depression.  Caffeine.  Neurological disorders, such as Alzheimer disease.  An overactive thyroid (hyperthyroidism). The cause of insomnia may not be known. What increases the risk? Risk factors for insomnia include:  Gender. Women are more commonly affected than men.  Age. Insomnia is more common as you get older.  Stress. This may involve your professional or personal life.  Income. Insomnia is more common in people with lower income.  Lack of exercise.  Irregular work schedule or night shifts.  Traveling between different time zones. What are the signs or symptoms? If you have insomnia, trouble falling asleep or trouble staying asleep is the main symptom. This may lead to other symptoms, such as:  Feeling fatigued.  Feeling nervous about going to sleep.  Not feeling rested in the morning.  Having trouble concentrating.  Feeling irritable,  anxious, or depressed. How is this treated? Treatment for insomnia depends on the cause. If your insomnia is caused by an underlying condition, treatment will focus on addressing the condition. Treatment may also include:  Medicines to help you sleep.  Counseling or therapy.  Lifestyle adjustments. Follow these instructions at home:  Take medicines only as directed by your health care provider.  Keep regular sleeping and waking hours. Avoid naps.  Keep a sleep diary to help you and your health care provider figure out what could be causing your insomnia. Include:  When you sleep.  When you wake up during the night.  How well you sleep.  How rested you feel the next day.  Any side effects of medicines you are taking.  What you eat and drink.  Make your bedroom a comfortable place where it is easy to fall asleep:  Put up shades or special blackout curtains to block light from outside.  Use a white noise machine to block noise.  Keep the temperature cool.  Exercise regularly as directed by your health care provider. Avoid exercising right before bedtime.  Use relaxation techniques to manage stress. Ask your health care provider to suggest some techniques that may work well for you. These may include:  Breathing exercises.  Routines to release muscle tension.  Visualizing peaceful scenes.  Cut back on alcohol, caffeinated beverages, and cigarettes, especially close to bedtime. These can disrupt your sleep.  Do not overeat or eat spicy foods right before bedtime. This can lead to digestive discomfort that can make it hard for you to sleep.  Limit screen use before bedtime. This includes:  Watching TV.  Using your smartphone, tablet, and computer.  Stick to a   routine. This can help you fall asleep faster. Try to do a quiet activity, brush your teeth, and go to bed at the same time each night.  Get out of bed if you are still awake after 15 minutes of trying to  sleep. Keep the lights down, but try reading or doing a quiet activity. When you feel sleepy, go back to bed.  Make sure that you drive carefully. Avoid driving if you feel very sleepy.  Keep all follow-up appointments as directed by your health care provider. This is important. Contact a health care provider if:  You are tired throughout the day or have trouble in your daily routine due to sleepiness.  You continue to have sleep problems or your sleep problems get worse. Get help right away if:  You have serious thoughts about hurting yourself or someone else. This information is not intended to replace advice given to you by your health care provider. Make sure you discuss any questions you have with your health care provider. Document Released: 02/04/2000 Document Revised: 07/09/2015 Document Reviewed: 11/07/2013 Elsevier Interactive Patient Education  2017 [employer]  

## 2016-02-01 NOTE — Progress Notes (Signed)
Subjective:    Patient ID: Michael Branch, male    DOB: 03/03/1955, 60 y.o.   MRN: [id number]  Pt presents to the office today for chronic follow up and pain medication refill. Pt requesting his amitriptyline 75 mg for insomnia.            Hyperlipidemia  This is a chronic problem. The current episode started more than 1 year ago. The problem is controlled. Recent lipid tests were reviewed and are normal. He has no history of diabetes. Pertinent negatives include no leg pain. Current antihyperlipidemic treatment includes diet change and statins. The current treatment provides mild improvement of lipids. Risk factors for coronary artery disease include dyslipidemia, hypertension, male sex, obesity and a sedentary lifestyle.  Hypertension  This is a chronic problem. The current episode started more than 1 year ago. The problem has been resolved since onset. The problem is controlled. Associated symptoms include malaise/fatigue. Pertinent negatives include no anxiety, palpitations or peripheral edema. Risk factors for coronary artery disease include dyslipidemia, male gender, obesity and family history. Past treatments include calcium channel blockers and diuretics. The current treatment provides moderate improvement. There is no history of kidney disease, CAD/MI, CVA, heart failure or a thyroid problem. There is no history of sleep apnea.  Insomnia  Primary symptoms: difficulty falling asleep, frequent awakening, malaise/fatigue.  The current episode started more than one year. The onset quality is gradual. The problem has been waxing and waning since onset. The symptoms are aggravated by pain. Past treatments include meditation. The treatment provided moderate relief. PMH includes: depression.  Arthritis  Presents for follow-up visit. The disease course has been fluctuating. He complains of pain. Affected locations include the neck and right shoulder. His pain is at a severity of 8/10. Associated  symptoms include pain while resting. Pertinent negatives include no dysuria. Past treatments include acetaminophen, NSAIDs, rest and an opioid. The treatment provided moderate relief.  Depression         This is a chronic problem.  The current episode started more than 1 year ago.   The onset quality is gradual.   The problem occurs intermittently.  The problem has been waxing and waning since onset.  Associated symptoms include helplessness, insomnia and sad.  Associated symptoms include no hopelessness, no decreased interest and no suicidal ideas.  Past treatments include nothing (stop lexapro, was working but felt "foggy").  Previous treatment provided no relief relief.  Past medical history includes chronic pain.     Pertinent negatives include no thyroid problem and no anxiety.   Pain assessment: Cause of pain-  Neck and back Pain location- Low back and neck Pain on scale of 1-10- 10 Frequency- Constantly What increases pain-twisting and moving What makes pain Better-Rest and pain medicaiton  Current medications- Norco 7.5-325 mg every 6 hours as needed #90  Effectiveness of current meds-Pain is worsening, will do ortho referral  Pill count performed-No Urine drug screen- No,  05/03/15 Was the [place] reviewed- Yes  If yes were their any concerning findings? - Only received controlled medication from me.    Review of Systems  Constitutional: Positive for malaise/fatigue.  Cardiovascular: Negative.  Negative for palpitations.  Gastrointestinal: Negative.   Endocrine: Negative.   Genitourinary: Negative.  Negative for dysuria.  Musculoskeletal: Positive for arthritis, back pain and neck stiffness.  Hematological: Negative.   Psychiatric/Behavioral: Positive for depression. Negative for suicidal ideas. The patient has insomnia.   All other systems reviewed and are negative.  Objective:   Physical Exam  Constitutional: He is oriented to person, place, and time. He appears  well-developed and well-nourished. No distress.  HENT:  Head: Normocephalic.  Right Ear: External ear normal.  Left Ear: External ear normal.  Nose: Nose normal.  Mouth/Throat: Oropharynx is clear and moist.  Eyes: Pupils are equal, round, and reactive to light. Right eye exhibits no discharge. Left eye exhibits no discharge.  Neck: Normal range of motion. Neck supple. No thyromegaly present.  Cardiovascular: Normal rate, regular rhythm, normal heart sounds and intact distal pulses.   No murmur heard. Pulmonary/Chest: Effort normal and breath sounds normal. No respiratory distress. He has no wheezes.  Abdominal: Soft. Bowel sounds are normal. He exhibits no distension. There is no tenderness.  Musculoskeletal: Normal range of motion. He exhibits tenderness. He exhibits no edema.  Decreased ROM of neck with rotation 90 degrees  Neurological: He is alert and oriented to person, place, and time. He has normal reflexes. No cranial nerve deficit.  Skin: Skin is warm and dry. No rash noted. No erythema.  Psychiatric: He has a normal mood and affect. His behavior is normal. Judgment and thought content normal.  Vitals reviewed.  Chest Xray- Negative Preliminary reading by Evelina Dun, FNP [facility]  EKG Sinus Rhythm   BP 125/84   Pulse (!) 104   Temp 97.4 F (36.3 C) (Oral)   Ht 5' 4.5" (1.638 m)   Wt 178 lb 6.4 oz (80.9 kg)   BMI 30.15 kg/m      Assessment & Plan:  1. Essential hypertension - CMP14+EGFR - EKG 12-Lead  2. Arthritis - CMP14+EGFR - HYDROcodone-acetaminophen (NORCO) 7.5-325 MG tablet; Take 1 tablet by mouth every 6 (six) hours as needed for moderate pain.  Dispense: 90 tablet; Refill: 0 - HYDROcodone-acetaminophen (NORCO) 7.5-325 MG tablet; Take 1 tablet by mouth every 6 (six) hours as needed for moderate pain.  Dispense: 90 tablet; Refill: 0 - HYDROcodone-acetaminophen (NORCO) 7.5-325 MG tablet; Take 1 tablet by mouth every 6 (six) hours as needed for moderate pain.   Dispense: 90 tablet; Refill: 0  3. Vitamin D deficiency - CMP14+EGFR  4. Pain medication agreement signed - CMP14+EGFR - HYDROcodone-acetaminophen (NORCO) 7.5-325 MG tablet; Take 1 tablet by mouth every 6 (six) hours as needed for moderate pain.  Dispense: 90 tablet; Refill: 0 - HYDROcodone-acetaminophen (NORCO) 7.5-325 MG tablet; Take 1 tablet by mouth every 6 (six) hours as needed for moderate pain.  Dispense: 90 tablet; Refill: 0 - HYDROcodone-acetaminophen (NORCO) 7.5-325 MG tablet; Take 1 tablet by mouth every 6 (six) hours as needed for moderate pain.  Dispense: 90 tablet; Refill: 0  5. Overweight (BMI 25.0-29.9) - CMP14+EGFR  6. Uncomplicated opioid dependence ([place]) - CMP14+EGFR - HYDROcodone-acetaminophen (NORCO) 7.5-325 MG tablet; Take 1 tablet by mouth every 6 (six) hours as needed for moderate pain.  Dispense: 90 tablet; Refill: 0 - HYDROcodone-acetaminophen (NORCO) 7.5-325 MG tablet; Take 1 tablet by mouth every 6 (six) hours as needed for moderate pain.  Dispense: 90 tablet; Refill: 0 - HYDROcodone-acetaminophen (NORCO) 7.5-325 MG tablet; Take 1 tablet by mouth every 6 (six) hours as needed for moderate pain.  Dispense: 90 tablet; Refill: 0  7. Metabolic syndrome - XMI68+EHOZ  8. Insomnia, unspecified type - CMP14+EGFR - traZODone (DESYREL) 50 MG tablet; Take 1-2 tablets (50-100 mg total) by mouth at bedtime as needed for sleep.  Dispense: 60 tablet; Refill: 3  9. Hyperlipidemia, unspecified hyperlipidemia type - CMP14+EGFR - EKG 12-Lead - Ambulatory referral to Cardiology  10. Depression, unspecified depression type - CMP14+EGFR  11. Chronic neck pain - CMP14+EGFR - HYDROcodone-acetaminophen (NORCO) 7.5-325 MG tablet; Take 1 tablet by mouth every 6 (six) hours as needed for moderate pain.  Dispense: 90 tablet; Refill: 0 - HYDROcodone-acetaminophen (NORCO) 7.5-325 MG tablet; Take 1 tablet by mouth every 6 (six) hours as needed for moderate pain.  Dispense: 90  tablet; Refill: 0 - HYDROcodone-acetaminophen (NORCO) 7.5-325 MG tablet; Take 1 tablet by mouth every 6 (six) hours as needed for moderate pain.  Dispense: 90 tablet; Refill: 0  12. Chronic bilateral low back pain with sciatica, sciatica laterality unspecified  - CMP14+EGFR - HYDROcodone-acetaminophen (NORCO) 7.5-325 MG tablet; Take 1 tablet by mouth every 6 (six) hours as needed for moderate pain.  Dispense: 90 tablet; Refill: 0 - HYDROcodone-acetaminophen (NORCO) 7.5-325 MG tablet; Take 1 tablet by mouth every 6 (six) hours as needed for moderate pain.  Dispense: 90 tablet; Refill: 0 - HYDROcodone-acetaminophen (NORCO) 7.5-325 MG tablet; Take 1 tablet by mouth every 6 (six) hours as needed for moderate pain.  Dispense: 90 tablet; Refill: 0  13. Hx of smoking - CMP14+EGFR - DG Chest 2 View; Future - Ambulatory referral to Cardiology  14. SOB (shortness of breath) - CMP14+EGFR - DG Chest 2 View; Future - EKG 12-Lead - Ambulatory referral to Cardiology  Continue all meds Labs pending Health Maintenance reviewed Diet and exercise encouraged RTO 3  months  Evelina Dun, FNP

## 2016-02-02 ENCOUNTER — Other Ambulatory Visit: Payer: Self-pay | Admitting: Family

## 2016-02-02 DIAGNOSIS — R748 Abnormal levels of other serum enzymes: Secondary | ICD-10-CM

## 2016-02-03 ENCOUNTER — Other Ambulatory Visit: Payer: Self-pay | Admitting: Family

## 2016-02-03 MED ORDER — ALBUTEROL SULFATE HFA 108 (90 BASE) MCG/ACT IN AERS: INHALATION_SPRAY | RESPIRATORY_TRACT | 1 refills | Status: DC

## 2016-02-09 ENCOUNTER — Other Ambulatory Visit: Payer: Self-pay | Admitting: Family

## 2016-02-09 ENCOUNTER — Ambulatory Visit (HOSPITAL_COMMUNITY)
Admission: RE | Admit: 2016-02-09 | Discharge: 2016-02-09 | Disposition: A | Discharge status: Home / Self Care | Payer: Medicare Other | Source: Ambulatory Visit | Attending: Family | Admitting: Family

## 2016-02-09 DIAGNOSIS — N281 Cyst of kidney, acquired: Secondary | ICD-10-CM | POA: Diagnosis not present

## 2016-02-09 DIAGNOSIS — I77811 Abdominal aortic ectasia: Secondary | ICD-10-CM | POA: Insufficient documentation

## 2016-02-09 DIAGNOSIS — R748 Abnormal levels of other serum enzymes: Secondary | ICD-10-CM | POA: Diagnosis not present

## 2016-02-09 DIAGNOSIS — I77819 Aortic ectasia, unspecified site: Secondary | ICD-10-CM | POA: Insufficient documentation

## 2016-02-09 DIAGNOSIS — K760 Fatty (change of) liver, not elsewhere classified: Secondary | ICD-10-CM | POA: Diagnosis not present

## 2016-02-09 NOTE — Assessment & Plan Note (Signed)
Needs repeat abd [place] 01/2021

## 2016-02-25 ENCOUNTER — Telehealth: Payer: Self-pay | Admitting: Family

## 2016-02-25 NOTE — Telephone Encounter (Signed)
Called number provided and was told I had the wrong number.

## 2016-03-02 ENCOUNTER — Ambulatory Visit (INDEPENDENT_AMBULATORY_CARE_PROVIDER_SITE_OTHER): Payer: Medicare Other | Admitting: Family

## 2016-03-02 ENCOUNTER — Telehealth: Payer: Self-pay | Admitting: Family

## 2016-03-02 ENCOUNTER — Encounter: Payer: Self-pay | Admitting: Family

## 2016-03-02 VITALS — BP 119/85 | HR 75 | Temp 97.4°F | Ht 64.5 in | Wt 179.2 lb

## 2016-03-02 DIAGNOSIS — R110 Nausea: Secondary | ICD-10-CM

## 2016-03-02 DIAGNOSIS — K2100 Gastro-esophageal reflux disease with esophagitis, without bleeding: Secondary | ICD-10-CM

## 2016-03-02 DIAGNOSIS — J0110 Acute frontal sinusitis, unspecified: Secondary | ICD-10-CM

## 2016-03-02 DIAGNOSIS — K5903 Drug induced constipation: Secondary | ICD-10-CM

## 2016-03-02 MED ORDER — AMOXICILLIN-POT CLAVULANATE 875-125 MG PO TABS: 1.0000 | ORAL_TABLET | Freq: Two times a day (BID) | ORAL | 0 refills | Status: DC

## 2016-03-02 MED ORDER — LINACLOTIDE 72 MCG PO CAPS: 72.0000 ug | ORAL_CAPSULE | Freq: Every day | ORAL | 1 refills | Status: DC

## 2016-03-02 MED ORDER — OMEPRAZOLE 40 MG PO CPDR: 40.0000 mg | DELAYED_RELEASE_CAPSULE | Freq: Every day | ORAL | 3 refills | Status: DC

## 2016-03-02 NOTE — Progress Notes (Signed)
Subjective:    Patient ID: Michael Branch, male    DOB: [birth year], 61 y.o.   MRN: [id number]  PT presents to the office today with recurrent nausea that has gradually worsen. PT states he can not finish his meals without "gaging". PT states he has nausea every time he eats. He has taken zofran with mild relief. Pt has GERD and takes OTC for that with moderate relief.  Cough  This is a new problem. The current episode started 1 to 4 weeks ago. The problem has been unchanged. The problem occurs every few minutes. Associated symptoms include chills, headaches, myalgias, nasal congestion, postnasal drip, rhinorrhea, sweats and wheezing. Pertinent negatives include no ear congestion or ear pain. The symptoms are aggravated by lying down. He has tried rest and OTC cough suppressant for the symptoms. The treatment provided mild relief.  Constipation  This is a chronic problem. The current episode started more than 1 year ago. The problem has been waxing and waning since onset. His stool frequency is 2 to 3 times per week. Associated symptoms include abdominal pain and back pain. He has tried diet changes for the symptoms. The treatment provided no relief.      Review of Systems  Constitutional: Positive for chills.  HENT: Positive for postnasal drip and rhinorrhea. Negative for ear pain.   Respiratory: Positive for cough and wheezing.   Gastrointestinal: Positive for abdominal pain and constipation.  Musculoskeletal: Positive for back pain and myalgias.  Neurological: Positive for headaches.  All other systems reviewed and are negative.      Objective:   Physical Exam  Constitutional: He is oriented to person, place, and time. He appears well-developed and well-nourished. No distress.  HENT:  Head: Normocephalic.  Right Ear: External ear normal.  Left Ear: External ear normal.  Nose: Mucosal edema and rhinorrhea present. Right sinus exhibits frontal sinus tenderness. Left sinus exhibits  frontal sinus tenderness.  Mouth/Throat: Posterior oropharyngeal erythema present.  Eyes: Pupils are equal, round, and reactive to light. Right eye exhibits no discharge. Left eye exhibits no discharge.  Neck: Normal range of motion. Neck supple. No thyromegaly present.  Cardiovascular: Normal rate, regular rhythm, normal heart sounds and intact distal pulses.   No murmur heard. Pulmonary/Chest: Effort normal and breath sounds normal. No respiratory distress. He has no wheezes.  Dry nonproductive cough   Abdominal: Soft. Bowel sounds are normal. He exhibits no distension. There is no tenderness.  Musculoskeletal: Normal range of motion. He exhibits no edema or tenderness.  Neurological: He is alert and oriented to person, place, and time.  Skin: Skin is warm and dry. No rash noted. No erythema.  Psychiatric: He has a normal mood and affect. His behavior is normal. Judgment and thought content normal.  Vitals reviewed.     BP 119/85   Pulse 75   Temp 97.4 F (36.3 C) (Oral)   Ht 5' 4.5" (1.638 m)   Wt 179 lb 3.2 oz (81.3 kg)   BMI 30.28 kg/m      Assessment & Plan:  1. Nausea without vomiting - Ambulatory referral to Gastroenterology - omeprazole (PRILOSEC) 40 MG capsule; Take 1 capsule (40 mg total) by mouth daily.  Dispense: 90 capsule; Refill: 3  2. Gastroesophageal reflux disease with esophagitis Pt started on prilosec 40 mg today -Diet discussed- Avoid fried, spicy, citrus foods, caffeine and alcohol -Do not eat 2-3 hours before bedtime -Encouraged small frequent meals -Avoid NSAID's -Keep GI appt - omeprazole (PRILOSEC) 40 MG  capsule; Take 1 capsule (40 mg total) by mouth daily.  Dispense: 90 capsule; Refill: 3  3. Acute frontal sinusitis, recurrence not specified - Take meds as prescribed - Use a cool mist humidifier  -Use saline nose sprays frequently -Saline irrigations of the nose can be very helpful if done frequently.  * 4X daily for 1 week*  * Use of a  nettie pot can be helpful with this. Follow directions with this* -Force fluids -For any cough or congestion  Use plain Mucinex- regular strength or max strength is fine   * Children- consult with Pharmacist for dosing -For fever or aces or pains- take tylenol or ibuprofen appropriate for age and weight.  * for fevers greater than 101 orally you may alternate ibuprofen and tylenol every  3 hours. -Throat lozenges if help -New toothbrush in 3 days - amoxicillin-clavulanate (AUGMENTIN) 875-125 MG tablet; Take 1 tablet by mouth 2 (two) times daily.  Dispense: 14 tablet; Refill: 0  4. Drug-induced constipation -Pt started on linzess today Force fluids - linaclotide (LINZESS) 72 MCG capsule; Take 1 capsule (72 mcg total) by mouth daily before breakfast.  Dispense: 90 capsule; Refill: [place], FNP

## 2016-03-02 NOTE — Patient Instructions (Signed)

## 2016-03-03 MED ORDER — LUBIPROSTONE 24 MCG PO CAPS: 24.0000 ug | ORAL_CAPSULE | Freq: Two times a day (BID) | ORAL | 3 refills | Status: DC

## 2016-03-03 NOTE — Telephone Encounter (Signed)
Aware. 

## 2016-03-03 NOTE — Telephone Encounter (Signed)
Amitiza Prescription sent to pharmacy to see if cheaper. Note written to excuse pt for jury duty.

## 2016-03-07 ENCOUNTER — Encounter: Payer: Self-pay | Admitting: Physician Assistant

## 2016-03-09 ENCOUNTER — Ambulatory Visit: Payer: Medicare Other | Admitting: Cardiology

## 2016-03-15 ENCOUNTER — Encounter: Payer: Self-pay | Admitting: Physician Assistant

## 2016-03-15 ENCOUNTER — Ambulatory Visit (INDEPENDENT_AMBULATORY_CARE_PROVIDER_SITE_OTHER): Payer: Medicare Other | Admitting: Physician Assistant

## 2016-03-15 VITALS — BP 130/78 | HR 82 | Ht 65.0 in | Wt 179.0 lb

## 2016-03-15 DIAGNOSIS — R110 Nausea: Secondary | ICD-10-CM

## 2016-03-15 DIAGNOSIS — Z1211 Encounter for screening for malignant neoplasm of colon: Secondary | ICD-10-CM | POA: Diagnosis not present

## 2016-03-15 DIAGNOSIS — K5900 Constipation, unspecified: Secondary | ICD-10-CM

## 2016-03-15 DIAGNOSIS — Z1212 Encounter for screening for malignant neoplasm of rectum: Secondary | ICD-10-CM

## 2016-03-15 DIAGNOSIS — K219 Gastro-esophageal reflux disease without esophagitis: Secondary | ICD-10-CM

## 2016-03-15 DIAGNOSIS — Z86010 Personal history of colonic polyps: Secondary | ICD-10-CM

## 2016-03-15 MED ORDER — NA SULFATE-K SULFATE-MG SULF 17.5-3.13-1.6 GM/177ML PO SOLN: 1.0000 | ORAL | 0 refills | Status: DC

## 2016-03-15 NOTE — Progress Notes (Signed)
Agree with assessment and plan as outlined.  

## 2016-03-15 NOTE — Progress Notes (Signed)
Chief Complaint: Nausea, GERD, Constipation, History of Polyps  HPI:  Mr. Sisak is a 61 year old Caucasian male with a past medical history of arthritis, GERD, colon polyps and previous Nissen fundoplication 30 years ago, as well as other history listed below, who was referred to me by Sharion Balloon, FNP for a complaint of nausea and constipation as well as a known history of polyps.    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 At that time pathology returned showing tubular adenoma, hyperplastic and tubulovillous adenoma. Patient was told to repeat colonoscopy in 3 years.   Today, the patient describes that he has had nonstop nausea for the past 10 months. He has not had any episodes of vomiting as he has a previous history of Nissen fundoplication around 30 years ago for uncontrollable reflux. Patient recently restarted on Omeprazole 40 mg about 2 weeks ago and feels like this helps his nausea in the morning, but his nausea will come back at night. He also uses Zofran 4 mg when necessary which seems to help for "a little while".   Patient also describes constipation due to his chronic pain medication use from arthritis in his neck. He tells me that he sometimes has normal daily bowel movements, but other times may go 3 days in between a bowel movement. Recently he has been using prune juice on a daily basis which seems to be keeping him regular. His primary care physician also recently prescribed Linzess, but he was unable to afford this and a new prescription for Amitiza is in place. He has not tried this yet. Patient does admit that recently his constipation has been giving him "cramping pain across my abdomen". He also tells me that if he hasn't had a bowel movement in a few days he is unable to lay on his  right side at night.   Patient denies fever, chills, blood in his stool, melena, weight loss, fatigue, anorexia, heartburn or symptoms of increased gas or bloating.  Past Medical History:  Diagnosis Date  . Arthritis    neck c2 down  . Colon polyps   . Elevated LFTs   . Fatigue    chronic  . Fibromyalgia   . GERD (gastroesophageal reflux disease)   . Hernia of abdominal wall   . Hyperlipidemia   . Hypertension   . Lower back pain    DDD  . Migraine   . Neck pain   . Numbness and tingling    both arms    Past Surgical History:  Procedure Laterality Date  . HAND SURGERY Right    finger was attached  . HERNIA REPAIR Left 02/21/1992  . NECK SURGERY  07/21/08   c5-c6  . neck surgery  10/21/2008   burs  . STOMACH SURGERY      Current Outpatient Prescriptions  Medication Sig Dispense Refill  . albuterol (PROAIR HFA) 108 (90 Base) MCG/ACT inhaler INHALE 2 PUFFS INTO THE LUNGS EVERY 6 HOURS AS NEEDED FOR WHEEZING OR SHORTNESS OF BREATH. 8.5 Inhaler 1  . amLODipine (NORVASC) 5 MG tablet Take 1 tablet (5 mg total) by mouth daily. 90 tablet 3  . atorvastatin (LIPITOR) 40 MG tablet Take 1 tablet (40 mg total) by mouth daily. 90 tablet 3  . cyclobenzaprine (FLEXERIL) 10 MG tablet TAKE 1 TABLET (10 MG  TOTAL) BY MOUTH 3 (THREE) TIMES DAILY AS NEEDED FOR MUSCLE SPASMS. 90 tablet 1  . hydrochlorothiazide (HYDRODIURIL) 25 MG tablet TAKE 1 TABLET (25 MG TOTAL) BY MOUTH DAILY. 90 tablet 2  . HYDROcodone-acetaminophen (NORCO) 7.5-325 MG tablet Take 1 tablet by mouth every 6 (six) hours as needed for moderate pain. 90 tablet 0  . lubiprostone (AMITIZA) 24 MCG capsule Take 1 capsule (24 mcg total) by mouth 2 (two) times daily with a meal. 60 capsule 3  . omeprazole (PRILOSEC) 40 MG capsule Take 1 capsule (40 mg total) by mouth daily. 90 capsule 3  . ondansetron (ZOFRAN) 4 MG tablet Take 1 tablet (4 mg total) by mouth every 8 (eight) hours as needed for nausea or vomiting. 30 tablet 2  .  triamcinolone ointment (KENALOG) 0.5 % Apply 1 application topically 2 (two) times daily. 30 g 0   No current facility-administered medications for this visit.     Allergies as of 03/15/2016 - Review Complete 03/15/2016  Allergen Reaction Noted  . Desyrel [trazodone] Other (See Comments) 03/15/2016  . Gabapentin  11/27/2012  . Lyrica [pregabalin]  11/27/2012    Family History  Problem Relation Age of Onset  . Diabetes Mother   . Colon cancer Father     dx in early 52's  . Diabetes Father   . Heart disease Father     Social History   Social History  . Marital status: Married    Spouse name: N/A  . Number of children: 2  . Years of education: N/A   Occupational History  . disabled    Social History Main Topics  . Smoking status: Former Smoker    Types: E-cigarettes  . Smokeless tobacco: Never Used  . Alcohol use 7.2 oz/week    12 Cans of beer per week  . Drug use: No  . Sexual activity: Not on file   Other Topics Concern  . Not on file   Social History Narrative  . No narrative on file    Review of Systems:     Constitutional: No weight loss, fever, chills, weakness or fatigue HEENT: Eyes: Slow vision changes               Ears, Nose, Throat:  Chronic hearing problems Skin: Rash Cardiovascular: No chest pain, chest pressure or palpitations   Respiratory: Shortness of breath Gastrointestinal: See HPI and otherwise negative Genitourinary: No dysuria or change in urinary frequency Neurological: Headaches Musculoskeletal: Arthritis, back pain and muscle cramps Hematologic: No bleeding or bruising Psychiatric: Anxiety   Physical Exam:  Vital signs: BP 130/78   Pulse 82   Ht 5\' 5"  (1.651 m)   Wt 179 lb (81.2 kg)   BMI 29.79 kg/m   Constitutional:   Pleasant Caucasian male appears to be in NAD, Well developed, Well nourished, alert and cooperative Head:  Normocephalic and atraumatic. Eyes:   PEERL, EOMI. No icterus. Conjunctiva pink. Ears:  Normal  auditory acuity. Neck:  Supple Throat: Oral cavity and pharynx without inflammation, swelling or lesion.  Respiratory: Respirations even and unlabored. Lungs clear to auscultation bilaterally.   No wheezes, crackles, or rhonchi.  Cardiovascular: Normal S1, S2. No MRG. Regular rate and rhythm. No peripheral edema, cyanosis or pallor.  Gastrointestinal: Diastasis Recti, Soft, nondistended, nontender. No rebound or guarding. Normal bowel sounds. No appreciable masses or hepatomegaly. Rectal:  Not performed.  Msk:  Symmetrical without gross deformities. Without edema, no deformity or joint abnormality.  Neurologic:  Alert and  oriented x4;  grossly normal neurologically.  Skin:   Dry and intact without significant lesions or rashes. Psychiatric:  Demonstrates good judgement and reason without abnormal affect or behaviors.  NO RECENT LABS.  Imaging:  ABDOMEN ULTRASOUND COMPLETE 02/09/16  COMPARISON:  None.  FINDINGS: Gallbladder: No gallstones or wall thickening visualized. No sonographic Murphy sign noted by sonographer.  Common bile duct: Diameter: 3.1 mm  Liver: Diffuse increased parenchymal echogenicity compatible with hepatic steatosis. No focal lesion.  IVC: No abnormality visualized.  Pancreas: Visualized portion unremarkable.  Spleen: Size and appearance within normal limits.  Right Kidney: Length: 11.7 cm. There are several cysts noted within the inferior pole of the left kidney. The largest measures 1.4 cm. Echogenicity within normal limits. No mass or hydronephrosis visualized.  Left Kidney: Length: 11.5 cm. Simple appearing upper pole cyst measures 4.9 cm in maximum dimension. Echogenicity within normal limits. No mass or hydronephrosis visualized.  Abdominal aorta: Measures 2.7 cm.  Other findings: None.  IMPRESSION: 1. Echogenic liver compatible with hepatic steatosis. 2. Kidney cysts 3. Ectatic abdominal aorta. Ectatic abdominal aorta at risk  for aneurysm development. Recommend followup by ultrasound in 5 years. This recommendation follows ACR consensus guidelines: White Paper of the ACR Incidental Findings Committee II on Vascular Findings. J Am Coll Radiol 2013; 10:789-794.   Electronically Signed   By: Kerby Moors M.D.   On: 02/09/2016 14:49  Assessment: 1. Nausea: History of Nissen fundoplication over 30 years ago, over the past 10 months patient has become chronically nauseous, he tells me this is somewhat better with Omeprazole 40 mg in the morning, but can feel this "wearing off", also somewhat helped by Zofran when necessary; consider relation to gastritis versus failing Nissen fundoplication versus other 2. GERD: Patient denies true heartburn or reflux symptoms especially on Omeprazole now, but continues with daily nausea as above 3. Constipation: Chronic for the patient due to pain medications, relieved currently on prune juice daily, patient does have a prescription for Amitiza from his PCP waiting for him 4. History of tubulovillous adenoma: Last colonoscopy in 2009, repeat recommended in 3 years, patient is overdue  Plan: 1. Scheduled patient for an EGD and colonoscopy for further evaluation. Discussed risks, benefits, limitations and alternatives and the patient agrees to proceed. This was scheduled with Dr. Havery Moros, as he is the supervising physician this morning, in the [place] 2. Patient to continue Omeprazole 40 mg daily, 30-60 minutes before breakfast 3. Patient to continue Zofran 4 mg every 4-6 hours as needed for nausea 4. Patient to continue prune juice daily for constipation, he can try Amitiza in the future if he feels this is necessary 5. Patient to follow in clinic per Dr. Doyne Keel recommendations after time of procedures  Ellouise Newer, PA-C [facility] 03/15/2016, 10:08 AM  Cc: Sharion Balloon, FNP

## 2016-03-15 NOTE — Patient Instructions (Addendum)
You have been scheduled for an endoscopy and colonoscopy. Please follow the written instructions given to you at your visit today. Please pick up your prep supplies at the pharmacy within the next 1-3 days. If you use inhalers (even only as needed), please bring them with you on the day of your procedure. Your physician has requested that you go to www.startemmi.com and enter the access code given to you at your visit today. This web site gives a general overview about your procedure. However, you should still follow specific instructions given to you by our office regarding your preparation for the procedure.   Continue Omeprazole 40 mg daily.   Continue Ondansetron 4 mg as needed for nausea.  Continue prune juice for constipation.

## 2016-03-16 ENCOUNTER — Ambulatory Visit (INDEPENDENT_AMBULATORY_CARE_PROVIDER_SITE_OTHER): Payer: Medicare Other | Admitting: Cardiovascular Disease

## 2016-03-16 ENCOUNTER — Encounter: Payer: Self-pay | Admitting: *Deleted

## 2016-03-16 ENCOUNTER — Encounter: Payer: Self-pay | Admitting: Cardiovascular Disease

## 2016-03-16 VITALS — BP 130/90 | HR 83 | Ht 65.0 in | Wt 180.0 lb

## 2016-03-16 DIAGNOSIS — E7800 Pure hypercholesterolemia, unspecified: Secondary | ICD-10-CM | POA: Diagnosis not present

## 2016-03-16 DIAGNOSIS — R079 Chest pain, unspecified: Secondary | ICD-10-CM | POA: Diagnosis not present

## 2016-03-16 DIAGNOSIS — I10 Essential (primary) hypertension: Secondary | ICD-10-CM

## 2016-03-16 DIAGNOSIS — R0602 Shortness of breath: Secondary | ICD-10-CM | POA: Diagnosis not present

## 2016-03-16 DIAGNOSIS — Z87891 Personal history of nicotine dependence: Secondary | ICD-10-CM

## 2016-03-16 MED ORDER — NITROGLYCERIN 0.4 MG SL SUBL: 0.4000 mg | SUBLINGUAL_TABLET | SUBLINGUAL | 3 refills | Status: DC | PRN

## 2016-03-16 MED ORDER — ASPIRIN EC 81 MG PO TBEC: 81.0000 mg | DELAYED_RELEASE_TABLET | Freq: Every day | ORAL | Status: DC

## 2016-03-16 NOTE — Progress Notes (Signed)
CARDIOLOGY CONSULT NOTE  Patient ID: Michael Branch MRN: [id number] DOB/AGE: [birth year] 61 y.o.  Admit date: (Not on file) Primary Physician: Evelina Dun, FNP Referring Physician:   Reason for Consultation: SOB, chest pain  HPI: The patient is a 61 year old male with a history of hypertension, hyperlipidemia, depression, and chronic pain, who presents for evaluation of chest pain and shortness of breath. He has a history of smoking.  Chest x-ray 02/01/16: Mild left base subsegmental atelectasis.  ECG 02/01/16: Normal sinus rhythm with a mild nonspecific T wave abnormality.  He has had chest pain which began 5 years ago but it was mild at that time. In the past several months he has had progressive exertional dyspnea and said he cannot walk any more than 2 minutes on a treadmill. He can only garden for about 5 minutes and begins wheezing. He has not had pulmonary function tests. He said he feels fatigued and has lost all strength. He has left-sided chest pains lasting anywhere from 2-4 seconds. They're described as sharp. Yesterday he was walking and felt like he was going to fall due to the chest pain and fatigue.  He's had nausea for 7-8 months and is scheduled for an EGD. He has mild abdominal pain and has constipation.  He has a history of GERD and has undergone Nissen fundoplication surgery.  He previously had the flu for about 5 weeks.  He quit smoking 4 years ago and now vapes.  Lipids 10/29/15: Total cholesterol 161, triglycerides 164, HDL 63, LDL 65.  Normal basic metabolic panel 90/21/11.  Vitamin D normal 10/29/15.  Soc: Moved to [place] from [place] 4 years ago as the cold weather was aggravating his neck and back pain.    Allergies  Allergen Reactions  . Desyrel [Trazodone] Other (See Comments)    "massive headache", made him "breathe deeply", "flush and dizzy"  . Gabapentin     Chest pain  . Lyrica [Pregabalin]     Swelling in the throat     Current Outpatient Prescriptions  Medication Sig Dispense Refill  . albuterol (PROAIR HFA) 108 (90 Base) MCG/ACT inhaler INHALE 2 PUFFS INTO THE LUNGS EVERY 6 HOURS AS NEEDED FOR WHEEZING OR SHORTNESS OF BREATH. 8.5 Inhaler 1  . atorvastatin (LIPITOR) 40 MG tablet Take 1 tablet (40 mg total) by mouth daily. 90 tablet 3  . hydrochlorothiazide (HYDRODIURIL) 25 MG tablet TAKE 1 TABLET (25 MG TOTAL) BY MOUTH DAILY. 90 tablet 2  . HYDROcodone-acetaminophen (NORCO) 7.5-325 MG tablet Take 1 tablet by mouth every 6 (six) hours as needed for moderate pain. 90 tablet 0  . lubiprostone (AMITIZA) 24 MCG capsule Take 1 capsule (24 mcg total) by mouth 2 (two) times daily with a meal. 60 capsule 3  . Na Sulfate-K Sulfate-Mg Sulf (SUPREP BOWEL PREP KIT) 17.5-3.13-1.6 GM/180ML SOLN Take 1 kit by mouth as directed. 324 mL 0  . omeprazole (PRILOSEC) 40 MG capsule Take 1 capsule (40 mg total) by mouth daily. 90 capsule 3  . ondansetron (ZOFRAN) 4 MG tablet Take 1 tablet (4 mg total) by mouth every 8 (eight) hours as needed for nausea or vomiting. 30 tablet 2  . triamcinolone ointment (KENALOG) 0.5 % Apply 1 application topically 2 (two) times daily. 30 g 0   No current facility-administered medications for this visit.     Past Medical History:  Diagnosis Date  . Arthritis    neck c2 down  . Colon polyps   . Elevated  LFTs   . Fatigue    chronic  . Fibromyalgia   . GERD (gastroesophageal reflux disease)   . Hernia of abdominal wall   . Hyperlipidemia   . Hypertension   . Lower back pain    DDD  . Migraine   . Neck pain   . Numbness and tingling    both arms    Past Surgical History:  Procedure Laterality Date  . HAND SURGERY Right    finger was attached  . HERNIA REPAIR Left 02/21/1992  . NECK SURGERY  07/21/08   c5-c6  . neck surgery  10/21/2008   burs  . STOMACH SURGERY      Social History   Social History  . Marital status: Married    Spouse name: N/A  . Number of children: 2   . Years of education: N/A   Occupational History  . disabled    Social History Main Topics  . Smoking status: Former Smoker    Types: E-cigarettes, Cigarettes    Quit date: 03/16/2012  . Smokeless tobacco: Never Used     Comment: uses Vapor eciggerrets 03/16/16  . Alcohol use 7.2 oz/week    12 Cans of beer per week  . Drug use: No  . Sexual activity: Not on file   Other Topics Concern  . Not on file   Social History Narrative  . No narrative on file     No family history of premature CAD in 1st degree relatives.  Prior to Admission medications   Medication Sig Start Date End Date Taking? Authorizing Provider  albuterol (PROAIR HFA) 108 (90 Base) MCG/ACT inhaler INHALE 2 PUFFS INTO THE LUNGS EVERY 6 HOURS AS NEEDED FOR WHEEZING OR SHORTNESS OF BREATH. 02/03/16   Sharion Balloon, FNP  amLODipine (NORVASC) 5 MG tablet Take 1 tablet (5 mg total) by mouth daily. 10/29/15   Sharion Balloon, FNP  atorvastatin (LIPITOR) 40 MG tablet Take 1 tablet (40 mg total) by mouth daily. 10/29/15   Sharion Balloon, FNP  cyclobenzaprine (FLEXERIL) 10 MG tablet TAKE 1 TABLET (10 MG TOTAL) BY MOUTH 3 (THREE) TIMES DAILY AS NEEDED FOR MUSCLE SPASMS. 07/29/15   Sharion Balloon, FNP  hydrochlorothiazide (HYDRODIURIL) 25 MG tablet TAKE 1 TABLET (25 MG TOTAL) BY MOUTH DAILY. 10/29/15   Sharion Balloon, FNP  HYDROcodone-acetaminophen (NORCO) 7.5-325 MG tablet Take 1 tablet by mouth every 6 (six) hours as needed for moderate pain. 02/01/16   Sharion Balloon, FNP  lubiprostone (AMITIZA) 24 MCG capsule Take 1 capsule (24 mcg total) by mouth 2 (two) times daily with a meal. 03/03/16   Christy A Hawks, FNP  Na Sulfate-K Sulfate-Mg Sulf (SUPREP BOWEL PREP KIT) 17.5-3.13-1.6 GM/180ML SOLN Take 1 kit by mouth as directed. 03/15/16   Levin Erp, PA  omeprazole (PRILOSEC) 40 MG capsule Take 1 capsule (40 mg total) by mouth daily. 03/02/16   Sharion Balloon, FNP  ondansetron (ZOFRAN) 4 MG tablet Take 1 tablet (4 mg  total) by mouth every 8 (eight) hours as needed for nausea or vomiting. 11/26/15   Sharion Balloon, FNP  triamcinolone ointment (KENALOG) 0.5 % Apply 1 application topically 2 (two) times daily. 10/29/15   Sharion Balloon, FNP     Review of systems complete and found to be negative unless listed above in HPI     Physical exam Blood pressure 130/90, pulse 83, height 5' 5"  (1.651 m), weight 180 lb (81.6 kg), SpO2 95 %. General:  NAD Neck: No JVD, no thyromegaly or thyroid nodule.  Lungs: Clear to auscultation bilaterally with normal respiratory effort. CV: Nondisplaced PMI. Regular rate and rhythm, normal S1/S2, no S3/S4, no murmur.  No peripheral edema.  No carotid bruit.    Abdomen: Soft, nontender, no hepatosplenomegaly, no distention.  Skin: Intact without lesions or rashes.  Neurologic: Alert and oriented x 3.  Psych: Normal affect. Extremities: No clubbing or cyanosis.  HEENT: Normal.   ECG: Most recent ECG reviewed.  Labs:   Lab Results  Component Value Date   WBC 6.2 10/16/2014   HGB 14.1 07/29/2013   HCT 42.7 10/16/2014   MCV 86 10/16/2014   PLT 235 10/16/2014   No results for input(s): NA, K, CL, CO2, BUN, CREATININE, CALCIUM, PROT, BILITOT, ALKPHOS, ALT, AST, GLUCOSE in the last 168 hours.  Invalid input(s): LABALBU No results found for: CKTOTAL, CKMB, CKMBINDEX, TROPONINI  Lab Results  Component Value Date   CHOL 161 10/29/2015   CHOL 163 07/29/2015   CHOL 164 01/21/2015   Lab Results  Component Value Date   HDL 63 10/29/2015   HDL 70 07/29/2015   HDL 68 01/21/2015   Lab Results  Component Value Date   LDLCALC 65 10/29/2015   LDLCALC 56 07/29/2015   LDLCALC 65 01/21/2015   Lab Results  Component Value Date   TRIG 164 (H) 10/29/2015   TRIG 186 (H) 07/29/2015   TRIG 153 (H) 01/21/2015   Lab Results  Component Value Date   CHOLHDL 2.6 10/29/2015   CHOLHDL 2.3 07/29/2015   CHOLHDL 2.4 01/21/2015   No results found for: LDLDIRECT        Studies: No results found.  ASSESSMENT AND PLAN:  1. Chest pain and shortness of breath: Symptoms are concerning for a cardiac etiology. He has several risk factors for ischemic heart disease. O2 sats are normal. I will proceed with a nuclear myocardial perfusion imaging study ([facility]) to evaluate for ischemic heart disease. I will start aspirin 81 mg daily and prescribe nitroglycerin to be used as needed. I will order a 2-D echocardiogram with Doppler to evaluate cardiac structure, function, and regional wall motion.  2. Hypertension: Mildly elevated diastolic BP. Will monitor.  3. Hyperlipidemia: Lipids reviewed above and within normal limits. Continue Lipitor.  Dispo: fu 3 weeks.   Signed: Kate Sable, M.D., F.A.C.C.  03/16/2016, 10:37 AM

## 2016-03-16 NOTE — Patient Instructions (Signed)
Medication Instructions:   Begin Aspirin 81mg  daily.  Begin Nitroglycerin as needed for severe chest pain only.  Continue all other medications.    Labwork: none  Testing/Procedures:  Your physician has requested that you have an echocardiogram. Echocardiography is a painless test that uses sound waves to create images of your heart. It provides your doctor with information about the size and shape of your heart and how well your heart's chambers and valves are working. This procedure takes approximately one hour. There are no restrictions for this procedure.  Your physician has requested that you have a lexiscan myoview. For further information please visit [email or web address]. Please follow instruction sheet, as given.  Office will contact with results via phone or letter.    Follow-Up: 3 weeks   Any Other Special Instructions Will Be Listed Below (If Applicable).  If you need a refill on your cardiac medications before your next appointment, please call your pharmacy.

## 2016-03-17 ENCOUNTER — Encounter: Payer: Self-pay | Admitting: Nurse Practitioner

## 2016-03-17 ENCOUNTER — Ambulatory Visit: Payer: Medicare Other | Admitting: Cardiovascular Disease

## 2016-03-17 ENCOUNTER — Ambulatory Visit (INDEPENDENT_AMBULATORY_CARE_PROVIDER_SITE_OTHER): Payer: Medicare Other | Admitting: Nurse Practitioner

## 2016-03-17 VITALS — BP 122/82 | HR 70 | Temp 97.4°F | Ht 65.0 in | Wt 179.0 lb

## 2016-03-17 DIAGNOSIS — M25511 Pain in right shoulder: Secondary | ICD-10-CM

## 2016-03-17 DIAGNOSIS — M7541 Impingement syndrome of right shoulder: Secondary | ICD-10-CM

## 2016-03-17 MED ORDER — BUPIVACAINE HCL 0.25 % IJ SOLN
1.0000 mL | Freq: Once | INTRAMUSCULAR | Status: AC
  Administered 2016-03-17: 1 mL via INTRA_ARTICULAR

## 2016-03-17 MED ORDER — METHYLPREDNISOLONE ACETATE 40 MG/ML IJ SUSP
40.0000 mg | Freq: Once | INTRAMUSCULAR | Status: AC
  Administered 2016-03-17: 40 mg via INTRA_ARTICULAR

## 2016-03-17 NOTE — Progress Notes (Signed)
   Subjective:    Patient ID: Michael Branch, male    DOB: 04/19/1955, 61 y.o.   MRN: [id number]  HPI Patient comes in today c/o right shoulder pain. Has frequent flare ups of shoulder impingemnet and shoulder injections really work . Has not had aniinjection in over a year. Michela Pitcher he was raking  Leaves and he thinks that is what flared it.    Review of Systems  Constitutional: Negative.   Respiratory: Negative.   Cardiovascular: Negative.   Genitourinary: Negative.   Neurological: Negative.   Psychiatric/Behavioral: Negative.   All other systems reviewed and are negative.      Objective:   Physical Exam  Constitutional: He is oriented to person, place, and time. He appears well-developed and well-nourished.  Cardiovascular: Normal rate and regular rhythm.   Pulmonary/Chest: Effort normal.  Musculoskeletal:  Decreased ROM of right shoulder due to pain on abduction at 45 degrees, internal and external rotation No point tenderness Motor strength and sensation distally intact Grips equal bil  Neurological: He is alert and oriented to person, place, and time.  Skin: Skin is warm.  Psychiatric: He has a normal mood and affect. His behavior is normal. Judgment and thought content normal.   BP 122/82   Pulse 70   Temp 97.4 F (36.3 C) (Oral)   Ht 5\' 5"  (1.651 m)   Wt 179 lb (81.2 kg)   BMI 29.79 kg/m   Procedure- marcaine  Pl 76ml with depomedrol 40mg  injrected in right shoulder jjoint under sterile technique     Assessment & Plan:  Shoulder impingement syndrome, right - Plan: bupivacaine (MARCAINE) 0.25 % (with pres) injection 1 mL, methylPREDNISolone acetate (DEPO-MEDROL) injection 40 mg  Ice bid Rest shoulder RTO prn  Mary-Margaret Hassell Done, FNP

## 2016-03-17 NOTE — Patient Instructions (Addendum)
Joint Pain Introduction Joint pain can be caused by many things. The joint can be bruised, infected, weak from aging, or sore from exercise. The pain will probably go away if you follow your doctor's instructions for home care. If your joint pain continues, more tests may be needed to help find the cause of your condition. Follow these instructions at home: Watch your condition for any changes. Follow these instructions as told to lessen the pain that you are feeling:  Take medicines only as told by your doctor.  Rest the sore joint for as long as told by your doctor. If your doctor tells you to, raise (elevate) the painful joint above the level of your heart while you are sitting or lying down.  Do not do things that cause pain or make the pain worse.  If told, put ice on the painful area:  Put ice in a plastic bag.  Place a towel between your skin and the bag.  Leave the ice on for 20 minutes, 2-3 times per day.  Wear an elastic bandage, splint, or sling as told by your doctor. Loosen the bandage or splint if your fingers or toes lose feeling (become numb) and tingle, or if they turn cold and blue.  Begin exercising or stretching the joint as told by your doctor. Ask your doctor what types of exercise are safe for you.  Keep all follow-up visits as told by your doctor. This is important. Contact a doctor if:  Your pain gets worse and medicine does not help it.  Your joint pain does not get better in 3 days.  You have more bruising or swelling.  You have a fever.  You lose 10 pounds (4.5 kg) or more without trying. Get help right away if:  You are not able to move the joint.  Your fingers or toes become numb or they turn cold and blue. This information is not intended to replace advice given to you by your health care provider. Make sure you discuss any questions you have with your health care provider. Document Released: 01/25/2009 Document Revised: 07/15/2015 Document  Reviewed: 11/18/2013  2017 [employer]vier

## 2016-03-24 ENCOUNTER — Encounter (HOSPITAL_COMMUNITY)
Admission: RE | Admit: 2016-03-24 | Discharge: 2016-03-24 | Disposition: A | Discharge status: Home / Self Care | Payer: Medicare Other | Source: Ambulatory Visit | Attending: Cardiovascular Disease | Admitting: Cardiovascular Disease

## 2016-03-24 ENCOUNTER — Encounter (HOSPITAL_COMMUNITY): Payer: Self-pay

## 2016-03-24 ENCOUNTER — Inpatient Hospital Stay (HOSPITAL_COMMUNITY): Admission: RE | Admit: 2016-03-24 | Payer: Medicare Other | Source: Ambulatory Visit

## 2016-03-24 ENCOUNTER — Ambulatory Visit (HOSPITAL_BASED_OUTPATIENT_CLINIC_OR_DEPARTMENT_OTHER)
Admission: RE | Admit: 2016-03-24 | Discharge: 2016-03-24 | Disposition: A | Discharge status: Home / Self Care | Payer: Medicare Other | Source: Ambulatory Visit | Attending: Cardiovascular Disease | Admitting: Cardiovascular Disease

## 2016-03-24 DIAGNOSIS — I071 Rheumatic tricuspid insufficiency: Secondary | ICD-10-CM

## 2016-03-24 DIAGNOSIS — I10 Essential (primary) hypertension: Secondary | ICD-10-CM

## 2016-03-24 DIAGNOSIS — R0602 Shortness of breath: Secondary | ICD-10-CM | POA: Insufficient documentation

## 2016-03-24 DIAGNOSIS — E785 Hyperlipidemia, unspecified: Secondary | ICD-10-CM | POA: Insufficient documentation

## 2016-03-24 DIAGNOSIS — R079 Chest pain, unspecified: Secondary | ICD-10-CM

## 2016-03-24 LAB — NM MYOCAR MULTI W/SPECT W/WALL MOTION / EF
CHL CUP NUCLEAR SRS: 0
CHL CUP RESTING HR STRESS: 72 {beats}/min
LV sys vol: 25 mL
LVDIAVOL: 65 mL (ref 62–150)
Peak HR: 99 {beats}/min
RATE: 0.32
SDS: 0
SSS: 0
TID: 1.08

## 2016-03-24 MED ORDER — TECHNETIUM TC 99M TETROFOSMIN IV KIT
30.0000 | PACK | Freq: Once | INTRAVENOUS | Status: AC | PRN
  Administered 2016-03-24: 30 via INTRAVENOUS

## 2016-03-24 MED ORDER — REGADENOSON 0.4 MG/5ML IV SOLN
INTRAVENOUS | Status: AC
  Administered 2016-03-24: 0.4 mg via INTRAVENOUS
  Filled 2016-03-24: qty 5

## 2016-03-24 MED ORDER — SODIUM CHLORIDE 0.9% FLUSH
INTRAVENOUS | Status: AC
  Administered 2016-03-24: 10 mL via INTRAVENOUS
  Filled 2016-03-24: qty 10

## 2016-03-24 MED ORDER — TECHNETIUM TC 99M TETROFOSMIN IV KIT
10.0000 | PACK | Freq: Once | INTRAVENOUS | Status: AC | PRN
  Administered 2016-03-24: 10 via INTRAVENOUS

## 2016-03-24 NOTE — Progress Notes (Signed)
*  PRELIMINARY RESULTS* Echocardiogram 2D Echocardiogram has been performed.  Samuel Germany 03/24/2016, 11:38 AM

## 2016-03-29 ENCOUNTER — Telehealth: Payer: Self-pay | Admitting: Family

## 2016-03-29 NOTE — Telephone Encounter (Signed)
Have him speak with Adonis Brook about this when she is back tomorrow, please forward to her

## 2016-03-29 NOTE — Telephone Encounter (Signed)
Covering PCP, please advise.  

## 2016-03-30 ENCOUNTER — Telehealth: Payer: Self-pay | Admitting: *Deleted

## 2016-03-30 MED ORDER — ZOLPIDEM TARTRATE 5 MG PO TABS: 5.0000 mg | ORAL_TABLET | Freq: Every evening | ORAL | 3 refills | Status: DC | PRN

## 2016-03-30 NOTE — Telephone Encounter (Signed)
Ambien Prescription sent to pharmacy   

## 2016-03-30 NOTE — Telephone Encounter (Signed)
ECHO -   Notes Recorded by Herminio Commons, MD on 03/24/2016 at 5:21 PM EST Normal pumping function.

## 2016-03-30 NOTE — Telephone Encounter (Signed)
Notes Recorded by Laurine Blazer, LPN on [id number] at 075-GRM PM EST Patient notified and verbalized understanding. Copy to pmd. Follow up already scheduled for 04/06/2016.

## 2016-03-30 NOTE — Telephone Encounter (Signed)
Ambien called into the pharmacy and pt is aware. Pt also wanting to be seen for congestion and cough. Pt given appt with Dr. Livia Snellen tomorrow at 8:55.

## 2016-03-30 NOTE — Telephone Encounter (Signed)
STRESS TEST -   Notes Recorded by Herminio Commons, MD on 03/24/2016 at 5:21 PM EST Normal.

## 2016-03-31 ENCOUNTER — Encounter: Payer: Self-pay | Admitting: Family Medicine

## 2016-03-31 ENCOUNTER — Ambulatory Visit (INDEPENDENT_AMBULATORY_CARE_PROVIDER_SITE_OTHER): Payer: Medicare Other | Admitting: Family Medicine

## 2016-03-31 VITALS — BP 111/71 | HR 87 | Temp 97.1°F | Ht 65.0 in | Wt 180.0 lb

## 2016-03-31 DIAGNOSIS — J329 Chronic sinusitis, unspecified: Secondary | ICD-10-CM

## 2016-03-31 DIAGNOSIS — J40 Bronchitis, not specified as acute or chronic: Secondary | ICD-10-CM | POA: Diagnosis not present

## 2016-03-31 DIAGNOSIS — G4700 Insomnia, unspecified: Secondary | ICD-10-CM

## 2016-03-31 MED ORDER — LEVOFLOXACIN 500 MG PO TABS: 500.0000 mg | ORAL_TABLET | Freq: Every day | ORAL | 0 refills | Status: DC

## 2016-03-31 NOTE — Progress Notes (Signed)
Subjective:  Patient ID: Michael Branch, male    DOB: 02/06/1956  Age: 61 y.o. MRN: [id number]  CC: Cough (pt here today c/o body aches, sore throat, sinus pressure)   HPI Michael Branch presents for Symptoms noted above. Says he's been sick for a couple months. Cough congestion facial pressure and severe headache waxing and waning. He has some neck problems when he has chronic migraines. However, they have been worse for the last couple of weeks. He saw Sherre Scarlet who gave him an antibiotic. That seemed to help when he was here 5 weeks ago. However it did not resolve the symptoms completely and over the last 2 weeks the symptoms have come back and gotten worse. Yesterday he called in because he was not able to get any sleep. The amitriptyline he was using for sleep went up to $300 for 3 months supply and he just couldn't afford that. Therefore Ms. Hawks called in Ambien and he says he got about 6 hours sleep last night. That's the best he's had a long time. She did give him a  month supply.   History Michael Branch has a past medical history of Arthritis; Colon polyps; Elevated LFTs; Fatigue; Fibromyalgia; GERD (gastroesophageal reflux disease); Hernia of abdominal wall; Hyperlipidemia; Hypertension; Lower back pain; Migraine; Neck pain; and Numbness and tingling.   He has a past surgical history that includes Neck surgery (07/21/08); neck surgery (10/21/2008); Hernia repair (Left, 02/21/1992); Hand surgery (Right); and Stomach surgery.   His family history includes Colon cancer in his father; Diabetes in his father and mother; Heart disease in his father.He reports that he quit smoking about 4 years ago. His smoking use included E-cigarettes and Cigarettes. He has never used smokeless tobacco. He reports that he drinks about 7.2 oz of alcohol per week . He reports that he does not use drugs.    ROS Review of Systems  Constitutional: Negative for activity change, appetite change, chills and  fever.  HENT: Positive for congestion, postnasal drip, rhinorrhea and sinus pressure. Negative for ear discharge, ear pain, hearing loss, nosebleeds, sneezing and trouble swallowing.   Respiratory: Negative for chest tightness and shortness of breath.   Cardiovascular: Negative for chest pain and palpitations.  Skin: Negative for rash.  Neurological: Positive for headaches.    Objective:  BP 111/71   Pulse 87   Temp 97.1 F (36.2 C) (Oral)   Ht 5' 5"  (1.651 m)   Wt 180 lb (81.6 kg)   BMI 29.95 kg/m   BP Readings from Last 3 Encounters:  03/31/16 111/71  03/17/16 122/82  03/16/16 130/90    Wt Readings from Last 3 Encounters:  03/31/16 180 lb (81.6 kg)  03/17/16 179 lb (81.2 kg)  03/16/16 180 lb (81.6 kg)     Physical Exam  Constitutional: He appears well-developed and well-nourished.  HENT:  Head: Normocephalic and atraumatic.  Right Ear: Tympanic membrane and external ear normal. No decreased hearing is noted.  Left Ear: Tympanic membrane and external ear normal. No decreased hearing is noted.  Nose: Mucosal edema present. Right sinus exhibits no frontal sinus tenderness. Left sinus exhibits no frontal sinus tenderness.  Mouth/Throat: No oropharyngeal exudate or posterior oropharyngeal erythema.  Neck: No Brudzinski's sign noted.  Pulmonary/Chest: Breath sounds normal. No respiratory distress.  Lymphadenopathy:       Head (right side): No preauricular adenopathy present.       Head (left side): No preauricular adenopathy present.       Right  cervical: No superficial cervical adenopathy present.      Left cervical: No superficial cervical adenopathy present.    Nm Myocar Multi W/spect W/wall Motion / Ef  Result Date: 03/24/2016  The study is normal.  This is a low risk study.  The left ventricular ejection fraction is normal (55-65%).  There was no ST segment deviation noted during stress.  Normal perfusion     Assessment & Plan:   Michael Branch was seen today for  cough.  Diagnoses and all orders for this visit:  Sinobronchitis  Insomnia, unspecified type  Other orders -     levofloxacin (LEVAQUIN) 500 MG tablet; Take 1 tablet (500 mg total) by mouth daily. For 10 days   And you the continue Ambien as ordered for insomnia for now. Follow-up with Ms. Hawks regarding insomnia problems.  I am having Michael Branch start on levofloxacin. I am also having him maintain his triamcinolone ointment, hydrochlorothiazide, atorvastatin, ondansetron, HYDROcodone-acetaminophen, albuterol, omeprazole, lubiprostone, Na Sulfate-K Sulfate-Mg Sulf, aspirin EC, nitroGLYCERIN, and zolpidem.  Allergies as of 03/31/2016      Reactions   Desyrel [trazodone] Other (See Comments)   "massive headache", made him "breathe deeply", "flush and dizzy"   Gabapentin    Chest pain   Lyrica [pregabalin]    Swelling in the throat      Medication List       Accurate as of 03/31/16  9:31 AM. Always use your most recent med list.          albuterol 108 (90 Base) MCG/ACT inhaler Commonly known as:  PROAIR HFA INHALE 2 PUFFS INTO THE LUNGS EVERY 6 HOURS AS NEEDED FOR WHEEZING OR SHORTNESS OF BREATH.   aspirin EC 81 MG tablet Take 1 tablet (81 mg total) by mouth daily.   atorvastatin 40 MG tablet Commonly known as:  LIPITOR Take 1 tablet (40 mg total) by mouth daily.   hydrochlorothiazide 25 MG tablet Commonly known as:  HYDRODIURIL TAKE 1 TABLET (25 MG TOTAL) BY MOUTH DAILY.   HYDROcodone-acetaminophen 7.5-325 MG tablet Commonly known as:  NORCO Take 1 tablet by mouth every 6 (six) hours as needed for moderate pain.   levofloxacin 500 MG tablet Commonly known as:  LEVAQUIN Take 1 tablet (500 mg total) by mouth daily. For 10 days   lubiprostone 24 MCG capsule Commonly known as:  AMITIZA Take 1 capsule (24 mcg total) by mouth 2 (two) times daily with a meal.   Na Sulfate-K Sulfate-Mg Sulf 17.5-3.13-1.6 GM/180ML Soln Commonly known as:  SUPREP BOWEL PREP KIT Take 1  kit by mouth as directed.   nitroGLYCERIN 0.4 MG SL tablet Commonly known as:  NITROSTAT Place 1 tablet (0.4 mg total) under the tongue every 5 (five) minutes as needed for chest pain.   omeprazole 40 MG capsule Commonly known as:  PRILOSEC Take 1 capsule (40 mg total) by mouth daily.   ondansetron 4 MG tablet Commonly known as:  ZOFRAN Take 1 tablet (4 mg total) by mouth every 8 (eight) hours as needed for nausea or vomiting.   triamcinolone ointment 0.5 % Commonly known as:  KENALOG Apply 1 application topically 2 (two) times daily.   zolpidem 5 MG tablet Commonly known as:  AMBIEN Take 1 tablet (5 mg total) by mouth at bedtime as needed for sleep.        Follow-up: Return if symptoms worsen or fail to improve.  Claretta Fraise, M.D.

## 2016-04-03 ENCOUNTER — Other Ambulatory Visit: Payer: Self-pay | Admitting: *Deleted

## 2016-04-03 DIAGNOSIS — K2100 Gastro-esophageal reflux disease with esophagitis, without bleeding: Secondary | ICD-10-CM

## 2016-04-03 DIAGNOSIS — E7800 Pure hypercholesterolemia, unspecified: Secondary | ICD-10-CM

## 2016-04-03 DIAGNOSIS — I10 Essential (primary) hypertension: Secondary | ICD-10-CM

## 2016-04-03 DIAGNOSIS — R110 Nausea: Secondary | ICD-10-CM

## 2016-04-03 DIAGNOSIS — E785 Hyperlipidemia, unspecified: Secondary | ICD-10-CM

## 2016-04-03 MED ORDER — HYDROCHLOROTHIAZIDE 25 MG PO TABS: ORAL_TABLET | ORAL | 1 refills | Status: DC

## 2016-04-03 MED ORDER — ONDANSETRON HCL 4 MG PO TABS: 4.0000 mg | ORAL_TABLET | Freq: Three times a day (TID) | ORAL | 2 refills | Status: DC | PRN

## 2016-04-03 MED ORDER — OMEPRAZOLE 40 MG PO CPDR: 40.0000 mg | DELAYED_RELEASE_CAPSULE | Freq: Every day | ORAL | 1 refills | Status: DC

## 2016-04-03 MED ORDER — ATORVASTATIN CALCIUM 40 MG PO TABS: 40.0000 mg | ORAL_TABLET | Freq: Every day | ORAL | 1 refills | Status: DC

## 2016-04-05 ENCOUNTER — Encounter: Payer: Self-pay | Admitting: Cardiovascular Disease

## 2016-04-05 ENCOUNTER — Other Ambulatory Visit: Payer: Self-pay | Admitting: *Deleted

## 2016-04-05 ENCOUNTER — Ambulatory Visit (INDEPENDENT_AMBULATORY_CARE_PROVIDER_SITE_OTHER): Payer: Medicare Other | Admitting: Cardiovascular Disease

## 2016-04-05 VITALS — BP 130/90 | HR 83 | Ht 65.0 in | Wt 181.0 lb

## 2016-04-05 DIAGNOSIS — I10 Essential (primary) hypertension: Secondary | ICD-10-CM

## 2016-04-05 DIAGNOSIS — E7800 Pure hypercholesterolemia, unspecified: Secondary | ICD-10-CM

## 2016-04-05 DIAGNOSIS — R0602 Shortness of breath: Secondary | ICD-10-CM

## 2016-04-05 DIAGNOSIS — M79605 Pain in left leg: Secondary | ICD-10-CM

## 2016-04-05 DIAGNOSIS — R079 Chest pain, unspecified: Secondary | ICD-10-CM

## 2016-04-05 DIAGNOSIS — Z87891 Personal history of nicotine dependence: Secondary | ICD-10-CM

## 2016-04-05 DIAGNOSIS — M79604 Pain in right leg: Secondary | ICD-10-CM

## 2016-04-05 MED ORDER — AMLODIPINE BESYLATE 5 MG PO TABS: 5.0000 mg | ORAL_TABLET | Freq: Every day | ORAL | 3 refills | Status: DC

## 2016-04-05 NOTE — Patient Instructions (Signed)
Medication Instructions:  Continue all current medications.  Labwork: none  Testing/Procedures:  Your physician has requested that you have an ankle brachial index (ABI). During this test an ultrasound and blood pressure cuff are used to evaluate the arteries that supply the arms and legs with blood. Allow thirty minutes for this exam. There are no restrictions or special instructions.  Your physician has recommended that you have a pulmonary function test. Pulmonary Function Tests are a group of tests that measure how well air moves in and out of your lungs.  Office will contact with results via phone or letter.    Follow-Up: 3 months   Any Other Special Instructions Will Be Listed Below (If Applicable).  If you need a refill on your cardiac medications before your next appointment, please call your pharmacy.

## 2016-04-05 NOTE — Progress Notes (Signed)
SUBJECTIVE: The patient returns for follow-up after undergoing cardiovascular testing performed for the evaluation of chest pain and shortness of breath.  Nuclear stress testing 03/24/16 was normal with no evidence of ischemia or scar.  Echocardiogram showed normal left ventricular systolic function, LVEF 11-94%, with grade 1 diastolic dysfunction.  He continues to complain of exertional dyspnea. He has a long history of tobacco abuse and quit 4 years ago. He also complains of bilateral leg pain and weakness for several years.  He also points to a tender spot on the left side of his chest. He has chest pains lasting 2-4 seconds at rest but denies exertional chest discomfort.  Review of Systems: As per "subjective", otherwise negative.  Allergies  Allergen Reactions  . Desyrel [Trazodone] Other (See Comments)    "massive headache", made him "breathe deeply", "flush and dizzy"  . Gabapentin     Chest pain  . Lyrica [Pregabalin]     Swelling in the throat    Current Outpatient Prescriptions  Medication Sig Dispense Refill  . albuterol (PROAIR HFA) 108 (90 Base) MCG/ACT inhaler INHALE 2 PUFFS INTO THE LUNGS EVERY 6 HOURS AS NEEDED FOR WHEEZING OR SHORTNESS OF BREATH. 8.5 Inhaler 1  . aspirin EC 81 MG tablet Take 1 tablet (81 mg total) by mouth daily.    Marland Kitchen atorvastatin (LIPITOR) 40 MG tablet Take 1 tablet (40 mg total) by mouth daily. 90 tablet 1  . hydrochlorothiazide (HYDRODIURIL) 25 MG tablet TAKE 1 TABLET (25 MG TOTAL) BY MOUTH DAILY. 90 tablet 1  . HYDROcodone-acetaminophen (NORCO) 7.5-325 MG tablet Take 1 tablet by mouth every 6 (six) hours as needed for moderate pain. 90 tablet 0  . levofloxacin (LEVAQUIN) 500 MG tablet Take 1 tablet (500 mg total) by mouth daily. For 10 days 10 tablet 0  . lubiprostone (AMITIZA) 24 MCG capsule Take 1 capsule (24 mcg total) by mouth 2 (two) times daily with a meal. 60 capsule 3  . Na Sulfate-K Sulfate-Mg Sulf (SUPREP BOWEL PREP KIT)  17.5-3.13-1.6 GM/180ML SOLN Take 1 kit by mouth as directed. 324 mL 0  . nitroGLYCERIN (NITROSTAT) 0.4 MG SL tablet Place 1 tablet (0.4 mg total) under the tongue every 5 (five) minutes as needed for chest pain. 25 tablet 3  . omeprazole (PRILOSEC) 40 MG capsule Take 1 capsule (40 mg total) by mouth daily. 90 capsule 1  . ondansetron (ZOFRAN) 4 MG tablet Take 1 tablet (4 mg total) by mouth every 8 (eight) hours as needed for nausea or vomiting. 30 tablet 2  . triamcinolone ointment (KENALOG) 0.5 % Apply 1 application topically 2 (two) times daily. 30 g 0  . zolpidem (AMBIEN) 5 MG tablet Take 1 tablet (5 mg total) by mouth at bedtime as needed for sleep. 30 tablet 3   No current facility-administered medications for this visit.     Past Medical History:  Diagnosis Date  . Arthritis    neck c2 down  . Colon polyps   . Elevated LFTs   . Fatigue    chronic  . Fibromyalgia   . GERD (gastroesophageal reflux disease)   . Hernia of abdominal wall   . Hyperlipidemia   . Hypertension   . Lower back pain    DDD  . Migraine   . Neck pain   . Numbness and tingling    both arms    Past Surgical History:  Procedure Laterality Date  . HAND SURGERY Right    finger was attached  .  HERNIA REPAIR Left 02/21/1992  . NECK SURGERY  07/21/08   c5-c6  . neck surgery  10/21/2008   burs  . STOMACH SURGERY      Social History   Social History  . Marital status: Married    Spouse name: N/A  . Number of children: 2  . Years of education: N/A   Occupational History  . disabled    Social History Main Topics  . Smoking status: Former Smoker    Types: E-cigarettes, Cigarettes    Quit date: 03/16/2012  . Smokeless tobacco: Never Used     Comment: uses Vapor eciggerrets 03/16/16  . Alcohol use 7.2 oz/week    12 Cans of beer per week  . Drug use: No  . Sexual activity: Not on file   Other Topics Concern  . Not on file   Social History Narrative  . No narrative on file     Vitals:    04/05/16 1255  BP: 130/90  Pulse: 83  SpO2: 98%  Weight: 181 lb (82.1 kg)  Height: 5' 5"  (1.651 m)    PHYSICAL EXAM General: NAD HEENT: Normal. Neck: No JVD, no thyromegaly. Lungs: Clear to auscultation bilaterally with normal respiratory effort. CV: Nondisplaced PMI.  Regular rate and rhythm, normal S1/S2, no S3/S4, no murmur. No pretibial or periankle edema.  No carotid bruit.   Abdomen: Soft, nontender, no distention.  Neurologic: Alert and oriented.  Psych: Normal affect. Skin: Normal. Musculoskeletal: No gross deformities.    ECG: Most recent ECG reviewed.      ASSESSMENT AND PLAN: 1. Chest pain and shortness of breath: While symptoms were concerning for a cardiac etiology and he has several risk factors for ischemic heart disease, nuclear stress testing was normal and echo showed normal LV systolic function. For the time being, does not need ASA or nitroglycerin. I will obtain PFT's.  2. Hypertension: Mildly elevated diastolic BP. Will monitor.  3. Hyperlipidemia: Lipids reviewed and within normal limits. Continue Lipitor.  4. Bilateral leg pain/weakness: Given his long history of tobacco abuse, he is at higher risk for peripheral arterial disease. I will obtain ABIs.  Dispo: fu 3 months.   Kate Sable, M.D., F.A.C.C.

## 2016-04-05 NOTE — Telephone Encounter (Signed)
Pt was called to clarify  = he is taking amlodipine and it was not on the file - it was refilled

## 2016-04-06 ENCOUNTER — Other Ambulatory Visit: Payer: Self-pay | Admitting: Cardiovascular Disease

## 2016-04-06 ENCOUNTER — Ambulatory Visit: Payer: Medicare Other | Admitting: Cardiovascular Disease

## 2016-04-06 DIAGNOSIS — I739 Peripheral vascular disease, unspecified: Secondary | ICD-10-CM

## 2016-04-20 ENCOUNTER — Ambulatory Visit: Payer: Medicare Other

## 2016-04-20 DIAGNOSIS — I739 Peripheral vascular disease, unspecified: Secondary | ICD-10-CM

## 2016-04-20 DIAGNOSIS — R202 Paresthesia of skin: Secondary | ICD-10-CM | POA: Diagnosis not present

## 2016-04-27 ENCOUNTER — Telehealth: Payer: Self-pay | Admitting: *Deleted

## 2016-04-27 NOTE — Telephone Encounter (Signed)
Notes Recorded by Laurine Blazer, LPN on 04/20/6740 at 5:52 PM EST Patient notified. Copy to pmd. ------  Notes Recorded by Laurine Blazer, LPN on 06/27/9481 at 4:75 PM EST No answer.  ------  Notes Recorded by Herminio Commons, MD on 04/21/2016 at 10:54 AM EST Normal.

## 2016-04-28 ENCOUNTER — Encounter: Payer: Self-pay | Admitting: Family

## 2016-04-28 ENCOUNTER — Ambulatory Visit (INDEPENDENT_AMBULATORY_CARE_PROVIDER_SITE_OTHER): Payer: Medicare Other | Admitting: Family

## 2016-04-28 VITALS — BP 128/88 | HR 96 | Temp 97.3°F | Ht 65.0 in | Wt 177.0 lb

## 2016-04-28 DIAGNOSIS — I10 Essential (primary) hypertension: Secondary | ICD-10-CM | POA: Diagnosis not present

## 2016-04-28 DIAGNOSIS — M5440 Lumbago with sciatica, unspecified side: Secondary | ICD-10-CM

## 2016-04-28 DIAGNOSIS — G4700 Insomnia, unspecified: Secondary | ICD-10-CM

## 2016-04-28 DIAGNOSIS — E8881 Metabolic syndrome: Secondary | ICD-10-CM | POA: Diagnosis not present

## 2016-04-28 DIAGNOSIS — G8929 Other chronic pain: Secondary | ICD-10-CM

## 2016-04-28 DIAGNOSIS — F329 Major depressive disorder, single episode, unspecified: Secondary | ICD-10-CM | POA: Diagnosis not present

## 2016-04-28 DIAGNOSIS — E559 Vitamin D deficiency, unspecified: Secondary | ICD-10-CM | POA: Diagnosis not present

## 2016-04-28 DIAGNOSIS — F1120 Opioid dependence, uncomplicated: Secondary | ICD-10-CM

## 2016-04-28 DIAGNOSIS — Z0289 Encounter for other administrative examinations: Secondary | ICD-10-CM

## 2016-04-28 DIAGNOSIS — M5441 Lumbago with sciatica, right side: Secondary | ICD-10-CM | POA: Diagnosis not present

## 2016-04-28 DIAGNOSIS — E785 Hyperlipidemia, unspecified: Secondary | ICD-10-CM

## 2016-04-28 DIAGNOSIS — M542 Cervicalgia: Secondary | ICD-10-CM | POA: Diagnosis not present

## 2016-04-28 DIAGNOSIS — F32A Depression, unspecified: Secondary | ICD-10-CM

## 2016-04-28 DIAGNOSIS — E663 Overweight: Secondary | ICD-10-CM

## 2016-04-28 DIAGNOSIS — M1990 Unspecified osteoarthritis, unspecified site: Secondary | ICD-10-CM

## 2016-04-28 MED ORDER — HYDROCODONE-ACETAMINOPHEN 7.5-325 MG PO TABS: 1.0000 | ORAL_TABLET | Freq: Four times a day (QID) | ORAL | 0 refills | Status: DC | PRN

## 2016-04-28 MED ORDER — METHYLPREDNISOLONE ACETATE 80 MG/ML IJ SUSP
80.0000 mg | Freq: Once | INTRAMUSCULAR | Status: AC
  Administered 2016-04-28: 80 mg via INTRAMUSCULAR

## 2016-04-28 MED ORDER — ZOLPIDEM TARTRATE 5 MG PO TABS: 5.0000 mg | ORAL_TABLET | Freq: Every evening | ORAL | 1 refills | Status: DC | PRN

## 2016-04-28 MED ORDER — HYDROCODONE-ACETAMINOPHEN 5-325 MG PO TABS: 1.0000 | ORAL_TABLET | Freq: Four times a day (QID) | ORAL | 0 refills | Status: DC | PRN

## 2016-04-28 NOTE — Patient Instructions (Signed)
Sciatica Rehab Ask your health care provider which exercises are safe for you. Do exercises exactly as told by your health care provider and adjust them as directed. It is normal to feel mild stretching, pulling, tightness, or discomfort as you do these exercises, but you should stop right away if you feel sudden pain or your pain gets worse.Do not begin these exercises until told by your health care provider. Stretching and range of motion exercises These exercises warm up your muscles and joints and improve the movement and flexibility of your hips and your back. These exercises also help to relieve pain, numbness, and tingling. Exercise A: Sciatic nerve glide  1. Sit in a chair with your head facing down toward your chest. Place your hands behind your back. Let your shoulders slump forward. 2. Slowly straighten one of your knees while you tilt your head back as if you are looking toward the ceiling. Only straighten your leg as far as you can without making your symptoms worse. 3. Hold for __________ seconds. 4. Slowly return to the starting position. 5. Repeat with your other leg. Repeat __________ times. Complete this exercise __________ times a day. Exercise B: Knee to chest with hip adduction and internal rotation    1. Lie on your back on a firm surface with both legs straight. 2. Bend one of your knees and move it up toward your chest until you feel a gentle stretch in your lower back and buttock. Then, move your knee toward the shoulder that is on the opposite side from your leg. ? Hold your leg in this position by holding onto the front of your knee. 3. Hold for __________ seconds. 4. Slowly return to the starting position. 5. Repeat with your other leg. Repeat __________ times. Complete this exercise __________ times a day. Exercise C: Prone extension on elbows    1. Lie on your abdomen on a firm surface. A bed may be too soft for this exercise. 2. Prop yourself up on your  elbows. 3. Use your arms to help lift your chest up until you feel a gentle stretch in your abdomen and your lower back. ? This will place some of your body weight on your elbows. If this is uncomfortable, try stacking pillows under your chest. ? Your hips should stay down, against the surface that you are lying on. Keep your hip and back muscles relaxed. 4. Hold for __________ seconds. 5. Slowly relax your upper body and return to the starting position. Repeat __________ times. Complete this exercise __________ times a day. Strengthening exercises These exercises build strength and endurance in your back. Endurance is the ability to use your muscles for a long time, even after they get tired. Exercise D: Pelvic tilt  1. Lie on your back on a firm surface. Bend your knees and keep your feet flat. 2. Tense your abdominal muscles. Tip your pelvis up toward the ceiling and flatten your lower back into the floor. ? To help with this exercise, you may place a small towel under your lower back and try to push your back into the towel. 3. Hold for __________ seconds. 4. Let your muscles relax completely before you repeat this exercise. Repeat __________ times. Complete this exercise __________ times a day. Exercise E: Alternating arm and leg raises    1. Get on your hands and knees on a firm surface. If you are on a hard floor, you may want to use padding to cushion your knees, such as   an exercise mat. 2. Line up your arms and legs. Your hands should be below your shoulders, and your knees should be below your hips. 3. Lift your left leg behind you. At the same time, raise your right arm and straighten it in front of you. ? Do not lift your leg higher than your hip. ? Do not lift your arm higher than your shoulder. ? Keep your abdominal and back muscles tight. ? Keep your hips facing the ground. ? Do not arch your back. ? Keep your balance carefully, and do not hold your breath. 4. Hold for  __________ seconds. 5. Slowly return to the starting position and repeat with your right leg and your left arm. Repeat __________ times. Complete this exercise __________ times a day. Posture and body mechanics    Body mechanics refers to the movements and positions of your body while you do your daily activities. Posture is part of body mechanics. Good posture and healthy body mechanics can help to relieve stress in your body's tissues and joints. Good posture means that your spine is in its natural S-curve position (your spine is neutral), your shoulders are pulled back slightly, and your head is not tipped forward. The following are general guidelines for applying improved posture and body mechanics to your everyday activities. Standing     When standing, keep your spine neutral and your feet about hip-width apart. Keep a slight bend in your knees. Your ears, shoulders, and hips should line up.  When you do a task in which you stand in one place for a long time, place one foot up on a stable object that is 2-4 inches (5-10 cm) high, such as a footstool. This helps keep your spine neutral. Sitting     When sitting, keep your spine neutral and keep your feet flat on the floor. Use a footrest, if necessary, and keep your thighs parallel to the floor. Avoid rounding your shoulders, and avoid tilting your head forward.  When working at a desk or a computer, keep your desk at a height where your hands are slightly lower than your elbows. Slide your chair under your desk so you are close enough to maintain good posture.  When working at a computer, place your monitor at a height where you are looking straight ahead and you do not have to tilt your head forward or downward to look at the screen. Resting     When lying down and resting, avoid positions that are most painful for you.  If you have pain with activities such as sitting, bending, stooping, or squatting (flexion-based  activities), lie in a position in which your body does not bend very much. For example, avoid curling up on your side with your arms and knees near your chest (fetal position).  If you have pain with activities such as standing for a long time or reaching with your arms (extension-based activities), lie with your spine in a neutral position and bend your knees slightly. Try the following positions: ? Lying on your side with a pillow between your knees. ? Lying on your back with a pillow under your knees. Lifting     When lifting objects, keep your feet at least shoulder-width apart and tighten your abdominal muscles.  Bend your knees and hips and keep your spine neutral. It is important to lift using the strength of your legs, not your back. Do not lock your knees straight out.  Always ask for help to lift heavy   or awkward objects. This information is not intended to replace advice given to you by your health care provider. Make sure you discuss any questions you have with your health care provider. Document Released: 02/06/2005 Document Revised: 10/14/2015 Document Reviewed: 10/23/2014 Elsevier Interactive Patient Education  2017 [employer]          

## 2016-04-28 NOTE — Progress Notes (Signed)
Subjective:    Patient ID: Michael Branch, male    DOB: 1956-01-09, 61 y.o.   MRN: [id number]  Pt presents to the office today for chronic follow up and pain medication refill. Pt has GI appt on 05/15/16 for colonoscopy.          Hyperlipidemia  This is a chronic problem. The current episode started more than 1 year ago. The problem is controlled. Recent lipid tests were reviewed and are normal. He has no history of diabetes. Associated symptoms include leg pain and shortness of breath ("at times"). Current antihyperlipidemic treatment includes diet change and statins. The current treatment provides mild improvement of lipids. Risk factors for coronary artery disease include dyslipidemia, hypertension, male sex, obesity and a sedentary lifestyle.  Hypertension  This is a chronic problem. The current episode started more than 1 year ago. The problem has been resolved since onset. The problem is controlled. Associated symptoms include malaise/fatigue and shortness of breath ("at times"). Pertinent negatives include no anxiety, palpitations or peripheral edema. Risk factors for coronary artery disease include dyslipidemia, male gender, obesity and family history. Past treatments include calcium channel blockers and diuretics. The current treatment provides moderate improvement. There is no history of kidney disease, CAD/MI, CVA or heart failure. There is no history of sleep apnea or a thyroid problem.  Insomnia  Primary symptoms: difficulty falling asleep, frequent awakening, malaise/fatigue.  The current episode started more than one year. The onset quality is gradual. The problem has been waxing and waning since onset. The symptoms are aggravated by pain. Past treatments include meditation. The treatment provided moderate relief. PMH includes: depression.  Arthritis  Presents for follow-up visit. The disease course has been fluctuating. He complains of pain. Affected locations include the neck and  right shoulder. His pain is at a severity of 8/10 ("5-10, average is 8"). Associated symptoms include pain while resting. Pertinent negatives include no dysuria. Past treatments include acetaminophen, NSAIDs, rest and an opioid. The treatment provided moderate relief.  Depression         This is a chronic problem.  The current episode started more than 1 year ago.   The onset quality is gradual.   The problem occurs intermittently.  The problem has been waxing and waning since onset.  Associated symptoms include helplessness, insomnia and sad.  Associated symptoms include no hopelessness, no decreased interest and no suicidal ideas.  Past treatments include nothing (stop lexapro, was working but felt "foggy").  Previous treatment provided no relief relief.  Past medical history includes chronic pain.     Pertinent negatives include no thyroid problem and no anxiety. Back Pain  This is a recurrent problem. The current episode started 1 to 4 weeks ago. The problem occurs constantly. The problem has been waxing and waning since onset. The pain is present in the gluteal. The quality of the pain is described as aching. The pain is at a severity of 8/10. The pain is moderate. The symptoms are aggravated by lying down. Associated symptoms include leg pain. Pertinent negatives include no dysuria. Risk factors include obesity. He has tried NSAIDs for the symptoms.  Constipation  This is a chronic problem. The current episode started more than 1 year ago. The problem has been waxing and waning since onset. His stool frequency is 4 to 5 times per week. Associated symptoms include back pain and nausea. He has tried laxatives and diet changes for the symptoms. The treatment provided mild relief.  Gastroesophageal Reflux  He complains of heartburn and nausea. He reports no belching, no choking or no dysphagia. This is a chronic problem. The current episode started more than 1 year ago. The problem has been gradually  improving. He has tried a PPI for the symptoms. The treatment provided moderate relief.    Pain assessment: Cause of pain-  Neck and back Pain location- Low back and neck Pain on scale of 1-10- 8 Frequency- Constantly What increases pain-twisting and moving What makes pain Better-Rest and pain medicaiton  Current medications- Norco 7.5-325 mg every 6 hours as needed #90  Effectiveness of current meds-Stable  Pill count performed-No Urine drug screen- No,  05/03/15 Was the [place] reviewed- Yes  If yes were their any concerning findings? - Only received controlled medication from me.    Review of Systems  Constitutional: Positive for malaise/fatigue.  Respiratory: Positive for shortness of breath ("at times"). Negative for choking.   Cardiovascular: Negative.  Negative for palpitations.  Gastrointestinal: Positive for constipation, heartburn and nausea. Negative for dysphagia.  Endocrine: Negative.   Genitourinary: Negative.  Negative for dysuria.  Musculoskeletal: Positive for arthritis, back pain and neck stiffness.  Hematological: Negative.   Psychiatric/Behavioral: Positive for depression. Negative for suicidal ideas. The patient has insomnia.   All other systems reviewed and are negative.      Objective:   Physical Exam  Constitutional: He is oriented to person, place, and time. He appears well-developed and well-nourished. No distress.  HENT:  Head: Normocephalic.  Right Ear: External ear normal.  Left Ear: External ear normal.  Nose: Nose normal.  Mouth/Throat: Oropharynx is clear and moist.  Eyes: Pupils are equal, round, and reactive to light. Right eye exhibits no discharge. Left eye exhibits no discharge.  Neck: Normal range of motion. Neck supple. No thyromegaly present.  Cardiovascular: Normal rate, regular rhythm, normal heart sounds and intact distal pulses.   No murmur heard. Pulmonary/Chest: Effort normal and breath sounds normal. No respiratory distress.  He has no wheezes.  Abdominal: Soft. Bowel sounds are normal. He exhibits no distension. There is no tenderness.  Musculoskeletal: Normal range of motion. He exhibits tenderness. He exhibits no edema.  Decreased ROM of neck with rotation 90 degrees, pain in right leg when laying down, negative SLR  Neurological: He is alert and oriented to person, place, and time. He has normal reflexes. No cranial nerve deficit.  Skin: Skin is warm and dry. No rash noted. No erythema.  Psychiatric: He has a normal mood and affect. His behavior is normal. Judgment and thought content normal.  Vitals reviewed.   BP 128/88   Pulse 96   Temp 97.3 F (36.3 C) (Oral)   Ht _0  (1.651 m)   Wt 177 lb (80.3 kg)   BMI 29.45 kg/m      Assessment & Plan:  1. Essential hypertension - CMP14+EGFR  2. Arthritis - CMP14+EGFR - HYDROcodone-acetaminophen (NORCO) 7.5-325 MG tablet; Take 1 tablet by mouth every 6 (six) hours as needed for moderate pain.  Dispense: 90 tablet; Refill: 0 - HYDROcodone-acetaminophen (NORCO) 7.5-325 MG tablet; Take 1 tablet by mouth every 6 (six) hours as needed for moderate pain.  Dispense: 90 tablet; Refill: 0 - HYDROcodone-acetaminophen (NORCO/VICODIN) 5-325 MG tablet; Take 1 tablet by mouth every 6 (six) hours as needed for moderate pain.  Dispense: 90 tablet; Refill: 0  3. Chronic bilateral low back pain with sciatica, sciatica laterality unspecified - CMP14+EGFR - HYDROcodone-acetaminophen (NORCO) 7.5-325 MG tablet; Take 1 tablet by mouth every 6 (  six) hours as needed for moderate pain.  Dispense: 90 tablet; Refill: 0 - HYDROcodone-acetaminophen (NORCO) 7.5-325 MG tablet; Take 1 tablet by mouth every 6 (six) hours as needed for moderate pain.  Dispense: 90 tablet; Refill: 0 - HYDROcodone-acetaminophen (NORCO/VICODIN) 5-325 MG tablet; Take 1 tablet by mouth every 6 (six) hours as needed for moderate pain.  Dispense: 90 tablet; Refill: 0  4. Chronic neck pain - CMP14+EGFR -  HYDROcodone-acetaminophen (NORCO) 7.5-325 MG tablet; Take 1 tablet by mouth every 6 (six) hours as needed for moderate pain.  Dispense: 90 tablet; Refill: 0 - HYDROcodone-acetaminophen (NORCO) 7.5-325 MG tablet; Take 1 tablet by mouth every 6 (six) hours as needed for moderate pain.  Dispense: 90 tablet; Refill: 0 - HYDROcodone-acetaminophen (NORCO/VICODIN) 5-325 MG tablet; Take 1 tablet by mouth every 6 (six) hours as needed for moderate pain.  Dispense: 90 tablet; Refill: 0  5. Depression, unspecified depression type - CMP14+EGFR  6. Hyperlipidemia, unspecified hyperlipidemia type  - CMP14+EGFR - Lipid panel  7. Uncomplicated opioid dependence ([place]) - CMP14+EGFR - HYDROcodone-acetaminophen (NORCO) 7.5-325 MG tablet; Take 1 tablet by mouth every 6 (six) hours as needed for moderate pain.  Dispense: 90 tablet; Refill: 0  8. Metabolic syndrome  - BAQ56+HCSP  9. Insomnia, unspecified type - CMP14+EGFR - zolpidem (AMBIEN) 5 MG tablet; Take 1 tablet (5 mg total) by mouth at bedtime as needed for sleep.  Dispense: 90 tablet; Refill: 1  10. Vitamin D deficiency  - CMP14+EGFR  11. Pain medication agreement signed - CMP14+EGFR - HYDROcodone-acetaminophen (NORCO) 7.5-325 MG tablet; Take 1 tablet by mouth every 6 (six) hours as needed for moderate pain.  Dispense: 90 tablet; Refill: 0 - HYDROcodone-acetaminophen (NORCO) 7.5-325 MG tablet; Take 1 tablet by mouth every 6 (six) hours as needed for moderate pain.  Dispense: 90 tablet; Refill: 0 - HYDROcodone-acetaminophen (NORCO/VICODIN) 5-325 MG tablet; Take 1 tablet by mouth every 6 (six) hours as needed for moderate pain.  Dispense: 90 tablet; Refill: 0  12. Overweight (BMI 25.0-29.9)  - CMP14+EGFR  13. Chronic right-sided low back pain with right-sided sciatica - methylPREDNISolone acetate (DEPO-MEDROL) injection 80 mg; Inject 1 mL (80 mg total) into the muscle once.   Continue all meds Labs pending Health Maintenance reviewed Diet  and exercise encouraged RTO 3 months   Evelina Dun, FNP

## 2016-04-29 LAB — CMP14+EGFR
A/G RATIO: 2 (ref 1.2–2.2)
ALBUMIN: 4.7 g/dL (ref 3.6–4.8)
ALT: 46 IU/L — ABNORMAL HIGH (ref 0–44)
AST: 27 IU/L (ref 0–40)
Alkaline Phosphatase: 86 IU/L (ref 39–117)
BUN / CREAT RATIO: 12 (ref 10–24)
BUN: 14 mg/dL (ref 8–27)
Bilirubin Total: 0.8 mg/dL (ref 0.0–1.2)
CO2: 28 mmol/L (ref 18–29)
CREATININE: 1.2 mg/dL (ref 0.76–1.27)
Calcium: 9.7 mg/dL (ref 8.6–10.2)
Chloride: 96 mmol/L (ref 96–106)
GFR calc non Af Amer: 65 mL/min/{1.73_m2} (ref >59)
GFR, EST AFRICAN AMERICAN: 76 mL/min/{1.73_m2} (ref >59)
GLOBULIN, TOTAL: 2.3 g/dL (ref 1.5–4.5)
Glucose: 84 mg/dL (ref 65–99)
POTASSIUM: 3.8 mmol/L (ref 3.5–5.2)
SODIUM: 140 mmol/L (ref 134–144)
TOTAL PROTEIN: 7 g/dL (ref 6.0–8.5)

## 2016-04-29 LAB — LIPID PANEL
CHOL/HDL RATIO: 2.7 ratio (ref 0.0–5.0)
Cholesterol, Total: 153 mg/dL (ref 100–199)
HDL: 56 mg/dL (ref >39)
LDL CALC: 54 mg/dL (ref 0–99)
Triglycerides: 213 mg/dL — ABNORMAL HIGH (ref 0–149)
VLDL Cholesterol Cal: 43 mg/dL — ABNORMAL HIGH (ref 5–40)

## 2016-05-01 ENCOUNTER — Encounter: Payer: Self-pay | Admitting: Gastroenterology

## 2016-05-12 ENCOUNTER — Other Ambulatory Visit: Payer: Self-pay | Admitting: Family

## 2016-05-15 ENCOUNTER — Encounter: Payer: Self-pay | Admitting: Gastroenterology

## 2016-05-15 ENCOUNTER — Ambulatory Visit (AMBULATORY_SURGERY_CENTER): Payer: Medicare Other | Admitting: Gastroenterology

## 2016-05-15 VITALS — BP 131/76 | HR 64 | Temp 99.1°F | Resp 14 | Ht 65.0 in | Wt 179.0 lb

## 2016-05-15 DIAGNOSIS — I10 Essential (primary) hypertension: Secondary | ICD-10-CM | POA: Diagnosis not present

## 2016-05-15 DIAGNOSIS — R110 Nausea: Secondary | ICD-10-CM

## 2016-05-15 DIAGNOSIS — D128 Benign neoplasm of rectum: Secondary | ICD-10-CM

## 2016-05-15 DIAGNOSIS — Z860100 Personal history of colon polyps, unspecified: Secondary | ICD-10-CM

## 2016-05-15 DIAGNOSIS — D127 Benign neoplasm of rectosigmoid junction: Secondary | ICD-10-CM | POA: Diagnosis not present

## 2016-05-15 DIAGNOSIS — D123 Benign neoplasm of transverse colon: Secondary | ICD-10-CM | POA: Diagnosis not present

## 2016-05-15 DIAGNOSIS — D129 Benign neoplasm of anus and anal canal: Secondary | ICD-10-CM

## 2016-05-15 DIAGNOSIS — D124 Benign neoplasm of descending colon: Secondary | ICD-10-CM

## 2016-05-15 DIAGNOSIS — D122 Benign neoplasm of ascending colon: Secondary | ICD-10-CM

## 2016-05-15 MED ORDER — SODIUM CHLORIDE 0.9 % IV SOLN: 500.0000 mL | INTRAVENOUS | Status: DC

## 2016-05-15 NOTE — Patient Instructions (Signed)
Handout was given on Polyps  YOU HAD AN ENDOSCOPIC PROCEDURE TODAY: Refer to the procedure report and other information in the discharge instructions given to you for any specific questions about what was found during the examination. If this information does not answer your questions, please call [place] office at [phone] to clarify.   YOU SHOULD EXPECT: Some feelings of bloating in the abdomen. Passage of more gas than usual. Walking can help get rid of the air that was put into your GI tract during the procedure and reduce the bloating. If you had a lower endoscopy (such as a colonoscopy or flexible sigmoidoscopy) you may notice spotting of blood in your stool or on the toilet paper. Some abdominal soreness may be present for a day or two, also.  DIET: Your first meal following the procedure should be a light meal and then it is ok to progress to your normal diet. A half-sandwich or bowl of soup is an example of a good first meal. Heavy or fried foods are harder to digest and may make you feel nauseous or bloated. Drink plenty of fluids but you should avoid alcoholic beverages for 24 hours. If you had a esophageal dilation, please see attached instructions for diet.    ACTIVITY: Your care partner should take you home directly after the procedure. You should plan to take it easy, moving slowly for the rest of the day. You can resume normal activity the day after the procedure however YOU SHOULD NOT DRIVE, use power tools, machinery or perform tasks that involve climbing or major physical exertion for 24 hours (because of the sedation medicines used during the test).   SYMPTOMS TO REPORT IMMEDIATELY: A gastroenterologist can be reached at any hour. Please call [phone]  for any of the following symptoms:  Following lower endoscopy (colonoscopy, flexible sigmoidoscopy) Excessive amounts of blood in the stool  Significant tenderness, worsening of abdominal pains  Swelling of the abdomen that  is new, acute  Fever of 100 or higher  Following upper endoscopy (EGD, EUS, ERCP, esophageal dilation) Vomiting of blood or coffee ground material  New, significant abdominal pain  New, significant chest pain or pain under the shoulder blades  Painful or persistently difficult swallowing  New shortness of breath  Black, tarry-looking or red, bloody stools  FOLLOW UP:  If any biopsies were taken you will be contacted by phone or by letter within the next 1-3 weeks. Call [phone]  if you have not heard about the biopsies in 3 weeks.  Please also call with any specific questions about appointments or follow up tests.

## 2016-05-15 NOTE — Op Note (Signed)
[facility] Patient Name: Michael Branch Procedure Date: 05/15/2016 9:25 AM MRN: [id number] Endoscopist: Michael Lipps P. 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 The patient's tolerance of                            previous anesthesia was also reviewed. The risks                            and benefits of the procedure and the sedation                            options and risks were discussed with the patient.                            All questions were answered, and informed consent                            was obtained. Prior Anticoagulants: The patient has                            taken no previous anticoagulant or antiplatelet                            agents. ASA Grade Assessment: II - A patient with                            mild systemic disease. After reviewing the risks                            and benefits, the patient was deemed in  satisfactory condition to undergo the procedure.                           After obtaining informed consent, the colonoscope                            was passed under direct vision. Throughout the                            procedure, the patient's blood pressure, pulse, and                            oxygen saturations were monitored continuously. The                            Colonoscope was introduced through  the anus and                            advanced to the the cecum, identified by                            appendiceal orifice and ileocecal valve. The                            colonoscopy was performed without difficulty. The                            patient tolerated the procedure well. The quality                            of the bowel preparation was good. The ileocecal                            valve, appendiceal orifice, and rectum were                            photographed. Scope In: 9:36:52 AM Scope Out: 10:24:32 AM Scope Withdrawal Time: 0 hours 40 minutes 49 seconds  Total Procedure Duration: 0 hours 47 minutes 40 seconds  Findings:                 The perianal and digital rectal examinations were                            normal.                           Seven sessile polyps were found in the ascending                            colon. The polyps were 4 to 7 mm in size. These                            polyps were removed with a cold snare. Resection  and retrieval were complete.                           Two sessile polyps were found in the hepatic                            flexure. The polyps were 5 to 6 mm in size. These                            polyps were removed with a cold snare. Resection                            and retrieval were complete.                           Four sessile polyps were found in the transverse                            colon. The polyps were 5 to 6 mm in size. These                            polyps were removed with a cold snare. Resection                            and retrieval were complete.                           Two sessile polyps were found in the descending                            colon. The polyps were 4 to 5 mm in size. These                            polyps were removed with a cold snare. Resection                            and retrieval were complete.                           Two  pedunculated polyps were found in the                            recto-sigmoid colon. The polyps were 6 to 7 mm in                            size. These polyps were removed with a hot snare.                            Resection and retrieval were complete.                           A large polyp was found in the rectum, at least 3cm  in size. The polyp was semi-pedunculated. The polyp                            was removed with a hot snare in piecemeal.                            Resection and retrieval were thought to have been                            complete complete although some abnormal mucosal                            was present around the base, unclear if this                            represented residual stalk tissue or adenomatous                            change. The base surrounding the polypectomy site                            was biopsied to ensure no adenomatous change. Area                            across from the polypectomy site was tattooed with                            an injection of Spot (carbon black). To prevent                            bleeding after the polypectomy, one hemostatic clip                            was successfully placed across the middle section                            of the defect where a erythematous area / ? vessel                            was noted to prevent bleeding. There was no                            bleeding during, or at the end, of the procedure.                           The exam was otherwise without abnormality on                            direct and retroflexion views. Complications:            No immediate complications. Estimated blood loss:  Minimal. Estimated Blood Loss:     Estimated blood loss was minimal. Impression:               - Seven 4 to 7 mm polyps in the ascending colon,                            removed with a cold snare. Resected and  retrieved.                           - Two 5 to 6 mm polyps at the hepatic flexure,                            removed with a cold snare. Resected and retrieved.                           - Four 5 to 6 mm polyps in the transverse colon,                            removed with a cold snare. Resected and retrieved.                           - Two 4 to 5 mm polyps in the descending colon,                            removed with a cold snare. Resected and retrieved.                           - Two 6 to 7 mm polyps at the recto-sigmoid colon,                            removed with a hot snare. Resected and retrieved.                           - One large polyp in the rectum, removed piecemeal                            with a hot snare. Resected and retrieved. Tattooed.                            Biopsied at base. Clip was placed prophylactically.                           - The examination was otherwise normal on direct                            and retroflexion views. Recommendation:           - Patient has a contact number available for                            emergencies. The signs and symptoms of potential  delayed complications were discussed with the                            patient. Return to normal activities tomorrow.                            Written discharge instructions were provided to the                            patient.                           - Resume previous diet.                           - Continue present medications.                           - No ibuprofen, naproxen, or other non-steroidal                            anti-inflammatory drugs for 2 weeks after polyp                            removal.                           - Await pathology results.                           - Repeat colonoscopy is recommended for                            surveillance. The colonoscopy date will be                            determined after  pathology results from today's                            exam become available for review. Michael Lipps P. Amahri Dengel MD, MD 05/15/2016 10:40:13 AM This report has been signed electronically.

## 2016-05-15 NOTE — Op Note (Signed)
[facility] Patient Name: Michael Branch Procedure Date: 05/15/2016 9:25 AM MRN: [id number] Endoscopist: Remo Lipps P. Addysen Louth MD, MD Age: 61 Referring MD:  Date of Birth: 1955/05/01 Gender: Male Account #: [id number] Procedure:                Upper GI endoscopy Indications:              Nausea Medicines:                Monitored Anesthesia Care Procedure:                Pre-Anesthesia Assessment:                           - Prior to the procedure, a History and Physical                            was performed, and patient medications and                            allergies were reviewed. The patient's tolerance of                            previous anesthesia was also reviewed. The risks                            and benefits of the procedure and the sedation                            options and risks were discussed with the patient.                            All questions were answered, and informed consent                            was obtained. Prior Anticoagulants: The patient has                            taken no previous anticoagulant or antiplatelet                            agents. ASA Grade Assessment: II - A patient with                            mild systemic disease. After reviewing the risks                            and benefits, the patient was deemed in                            satisfactory condition to undergo the procedure.                           After obtaining informed consent, the endoscope was  passed under direct vision. Throughout the                            procedure, the patient's blood pressure, pulse, and                            oxygen saturations were monitored continuously. The                            Endoscope was introduced through the mouth, and                            advanced to the second part of duodenum. The upper                            GI endoscopy was accomplished without  difficulty.                            The patient tolerated the procedure well. Scope In: Scope Out: Findings:                 Esophagogastric landmarks were identified: the                            Z-line was found at 36 cm, the gastroesophageal                            junction was found at 36 cm and the upper extent of                            the gastric folds was found at 36 cm from the                            incisors.                           The exam of the esophagus was otherwise normal.                           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 The wrap appeared intact.                           The exam of the stomach was otherwise normal.                           Biopsies were taken with a cold forceps in the                            gastric body and in the gastric antrum for  Helicobacter pylori testing.                           The duodenal bulb and second portion of the                            duodenum were normal. Complications:            No immediate complications. Estimated blood loss:                            Minimal. Estimated Blood Loss:     Estimated blood loss was minimal. Impression:               - Esophagogastric landmarks identified.                           - Normal esophagus otherwise                           - A Nissen fundoplication was found. The wrap                            appears intact.                           - Normal duodenal bulb and second portion of the                            duodenum.                           - Biopsies were taken with a cold forceps for                            Helicobacter pylori testing.                           No obvious cause for nausea noted on this exam,                            will await biopsy results. It's possible chronic                            narcotic use is causing symptoms. Recommendation:            - Patient has a contact number available for                            emergencies. The signs and symptoms of potential                            delayed complications were discussed with the                            patient. Return to normal activities tomorrow.  Written discharge instructions were provided to the                            patient.                           - Resume previous diet.                           - Continue present medications.                           - Await pathology results. Remo Lipps P. Paitynn Mikus MD, MD 05/15/2016 10:43:48 AM This report has been signed electronically.

## 2016-05-15 NOTE — Progress Notes (Signed)
Called to room to assist during endoscopic procedure.  Patient ID and intended procedure confirmed with present staff. Received instructions for my participation in the procedure from the performing physician.  

## 2016-05-15 NOTE — Progress Notes (Signed)
To recovery, repoirt to Danne Baxter, RN, VSS.

## 2016-05-15 NOTE — Progress Notes (Signed)
No egg or soy allergy known to patient   issues with past sedation with any surgeries  or procedures of last neck surgery elevated BP-, no intubation problems  No diet pills per patient No home 02 use per patient  No blood thinners per patient  Pt denies issues with constipation  No A fib or A flutter

## 2016-05-16 ENCOUNTER — Telehealth: Payer: Self-pay | Admitting: *Deleted

## 2016-05-16 NOTE — Telephone Encounter (Signed)
  Follow up Call-  Call back number 05/15/2016  Post procedure Call Back phone  # [phone]  Permission to leave phone message No  Some recent data might be hidden     Patient questions:  Do you have a fever, pain , or abdominal swelling? No. Pain Score  0 *  Have you tolerated food without any problems? Yes.    Have you been able to return to your normal activities? Yes.    Do you have any questions about your discharge instructions: Diet   No. Medications  No. Follow up visit  Yes.    Do you have questions or concerns about your Care? No.  Actions: * If pain score is 4 or above: No action needed, pain <4.  Patient questioning when next check up will be. Reviewed epic report with patient. Patient satisfied once review completed.

## 2016-05-22 ENCOUNTER — Other Ambulatory Visit: Payer: Self-pay

## 2016-05-22 DIAGNOSIS — K635 Polyp of colon: Secondary | ICD-10-CM

## 2016-05-30 ENCOUNTER — Encounter: Payer: Self-pay | Admitting: Family

## 2016-05-30 ENCOUNTER — Ambulatory Visit (INDEPENDENT_AMBULATORY_CARE_PROVIDER_SITE_OTHER): Payer: Medicare Other | Admitting: Family

## 2016-05-30 VITALS — BP 117/81 | HR 119 | Temp 97.3°F | Ht 65.0 in | Wt 176.6 lb

## 2016-05-30 DIAGNOSIS — G8929 Other chronic pain: Secondary | ICD-10-CM | POA: Diagnosis not present

## 2016-05-30 DIAGNOSIS — G4700 Insomnia, unspecified: Secondary | ICD-10-CM | POA: Diagnosis not present

## 2016-05-30 DIAGNOSIS — M5442 Lumbago with sciatica, left side: Secondary | ICD-10-CM

## 2016-05-30 MED ORDER — ZOLPIDEM TARTRATE 5 MG PO TABS: 10.0000 mg | ORAL_TABLET | Freq: Every evening | ORAL | 1 refills | Status: DC | PRN

## 2016-05-30 MED ORDER — KETOROLAC TROMETHAMINE 60 MG/2ML IM SOLN
60.0000 mg | Freq: Once | INTRAMUSCULAR | Status: AC
  Administered 2016-05-30: 60 mg via INTRAMUSCULAR

## 2016-05-30 MED ORDER — METHYLPREDNISOLONE ACETATE 80 MG/ML IJ SUSP
80.0000 mg | Freq: Once | INTRAMUSCULAR | Status: AC
  Administered 2016-05-30: 80 mg via INTRAMUSCULAR

## 2016-05-30 NOTE — Progress Notes (Signed)
   Subjective:    Patient ID: Michael Branch, male    DOB: Nov 03, 1955, 61 y.o.   MRN: [id number]  Back Pain  This is a chronic problem. The current episode started more than 1 year ago. The problem occurs intermittently. The problem has been waxing and waning since onset. The pain is present in the lumbar spine and gluteal. The quality of the pain is described as aching. The pain radiates to the left thigh. The pain is at a severity of 9/10. The pain is moderate. The symptoms are aggravated by twisting and lying down. Associated symptoms include leg pain and weakness. Pertinent negatives include no bladder incontinence or bowel incontinence. He has tried bed rest, NSAIDs and home exercises for the symptoms. The treatment provided mild relief.  Insomnia  Primary symptoms: difficulty falling asleep, frequent awakening.  The current episode started more than one year. The onset quality is gradual. The problem occurs intermittently. The problem has been waxing and waning since onset. The symptoms are aggravated by caffeine. The treatment provided mild relief.      Review of Systems  Gastrointestinal: Negative for bowel incontinence.  Genitourinary: Negative for bladder incontinence.  Musculoskeletal: Positive for back pain.  Neurological: Positive for weakness.  Psychiatric/Behavioral: The patient has insomnia.   All other systems reviewed and are negative.      Objective:   Physical Exam  Constitutional: He is oriented to person, place, and time. He appears well-developed and well-nourished. No distress.  HENT:  Head: Normocephalic.  Cardiovascular: Normal rate, regular rhythm, normal heart sounds and intact distal pulses.   No murmur heard. Pulmonary/Chest: Effort normal and breath sounds normal. No respiratory distress. He has no wheezes.  Abdominal: Soft. Bowel sounds are normal. He exhibits no distension. There is no tenderness.  Musculoskeletal: Normal range of motion. He exhibits  tenderness. He exhibits no edema.  Pain in lower back that radiates to left thigh, negative SLR   Neurological: He is alert and oriented to person, place, and time.  Skin: Skin is warm and dry. No rash noted. No erythema.  Psychiatric: He has a normal mood and affect. His behavior is normal. Judgment and thought content normal.  Vitals reviewed.   BP 117/81   Pulse (!) 119   Temp 97.3 F (36.3 C) (Oral)   Ht 5\' 5"  (1.651 m)   Wt 176 lb 9.6 oz (80.1 kg)   BMI 29.39 kg/m       Assessment & Plan:  1. Chronic left-sided low back pain with left-sided sciatica -Rest -Ice and heat  ROM exercises encouraged -Pt has muscle relaxer at home he can take Continue Motrin prn2 - methylPREDNISolone acetate (DEPO-MEDROL) injection 80 mg; Inject 1 mL (80 mg total) into the muscle once. - ketorolac (TORADOL) injection 60 mg; Inject 2 mLs (60 mg total) into the muscle once.  2. Insomnia, unspecified type -Ambien increased to 10 mg from 5 mg -Sleep ritual discussed RTO prn  - zolpidem (AMBIEN) 5 MG tablet; Take 2 tablets (10 mg total) by mouth at bedtime as needed for sleep.  Dispense: 180 tablet; Refill: [place], FNP

## 2016-05-30 NOTE — Patient Instructions (Signed)
Spondylolysis Rehab Ask your health care provider which exercises are safe for you. Do exercises exactly as told by your health care provider and adjust them as directed. It is normal to feel mild stretching, pulling, tightness, or discomfort as you do these exercises, but you should stop right away if you feel sudden pain or your pain gets worse. Do not begin these exercises until told by your health care provider. Stretching and range of motion exercises These exercises warm up your muscles and joints and improve the movement and flexibility of your hips and your back. These exercises may also help to relieve pain, numbness, and tingling. Exercise A: Single knee to chest   1. Lie on your back on a firm surface with both legs straight. 2. Bend one of your knees. Use your hands to move your knee up toward your chest until you feel a gentle stretch in your lower back and buttock.  Hold your leg in this position by holding onto the front of your knee.  Keep your other leg as straight as possible. 3. Hold for __________ seconds. 4. Slowly return to the starting position. 5. Repeat this exercise with your other leg. Repeat __________ times. Complete this exercise __________ times a day. Exercise B: Hamstring stretch, supine   1. Lie on your back. 2. Hold both ends of a belt or towel as you loop it over the ball of one of your feet. The ball of your foot is on the walking surface, right under your toes. 3. Straighten your knee and slowly pull on the belt to raise your leg.  Do not let your knee bend while you do this.  Keep your other leg flat on the floor.  Raise the leg until you feel a gentle stretch in the back of your knee or thigh. 4. Hold for __________ seconds. 5. If told by your health care provider, repeat this exercise with your other leg. Repeat __________ times. Complete this exercise __________ times a day. Strengthening exercises These exercises build strength and endurance  in your back. Endurance is the ability to use your muscles for a long time, even after they get tired. Exercise C: Pelvic tilt  1. Lie on your back on a firm bed or the floor. Bend your knees and keep your feet flat. 2. Tense your abdominal muscles. Tip your pelvis up toward the ceiling and flatten your lower back into the floor.  To help with this exercise, you may place a small towel under your lower back and try to push your back into the towel. 3. Hold for __________ seconds. 4. Let your muscles relax completely before you repeat this exercise. Repeat __________ times. Complete this exercise __________ times a day. Exercise D: Abdominal crunch   1. Lie on your back on a firm surface. Bend your knees and keep your feet flat. Cross your arms over your chest. 2. Tuck your chin down toward your chest, without bending your neck. 3. Use your abdominal muscles to lift your upper body off of the ground, straight up into the air.  Try to lift yourself until your shoulder blades are off the ground. You may need to work up to this.  Keep your lower back on the ground while you crunch upward.  Do not hold your breath. 4. Slowly lower yourself down. Keep your abdominal muscles tense until you are back to the starting position. Repeat __________ times. Complete this exercise __________ times a day. Exercise E: Alternating arm and leg  raises   1. Get on your hands and knees on a firm surface. If you are on a hard floor, you may want to use padding to cushion your knees, such as an exercise mat. 2. Line up your arms and legs. Your hands should be below your shoulders, and your knees should be below your hips. 3. Lift your left leg behind you. At the same time, raise your right arm and straighten it in front of you.  Do not lift your leg higher than your hip.  Do not lift your arm higher than your shoulder.  Keep your abdominal and back muscles tight.  Keep your hips facing the ground.  Do not  arch your back.  Keep your balance carefully, and do not hold your breath. 4. Hold for __________ seconds. 5. Slowly return to the starting position and repeat with your right leg and your left arm. Repeat __________ times. Complete this exercise __________ times a day. Posture and body mechanics Body mechanics refers to the movements and positions of your body while you do your daily activities. Posture is part of body mechanics. Good posture and healthy body mechanics can help to relieve stress in your body's tissues and joints. Good posture means that your spine is in its natural S-curve position (your spine is neutral), your shoulders are pulled back slightly, and your head is not tipped forward. The following are general guidelines for applying improved posture and body mechanics to your everyday activities. Standing    When standing, keep your spine neutral and your feet about hip-width apart. Keep a slight bend in your knees. Your ears, shoulders, and hips should line up with each other.  When you do a task in which you stand in one place for a long time, place one foot up on a stable object that is 2-4 inches (5-10 cm) high, such as a footstool. This helps keep your spine neutral. Sitting    When sitting, keep your spine neutral and keep your feet flat on the floor. Use a footrest, if necessary, and keep your thighs parallel to the floor. Avoid rounding your shoulders, and avoid tilting your head forward.  When working at a desk or a computer, keep your desk at a height where your hands are slightly lower than your elbows. Slide your chair under your desk so you are close enough to maintain good posture.  When working at a computer, place your monitor at a height where you are looking straight ahead and you do not have to tilt your head forward or downward to look at the screen. Resting   When lying down and resting, avoid positions that are most painful for you.  If you have pain  with activities such as sitting, bending, stooping, or squatting (flexion-based activities), lie in a position in which your body does not bend very much. For example, avoid curling up on your side with your arms and knees near your chest (fetal position).  If you have pain with activities such as standing for a long time or reaching with your arms (extension-based activities), lie with your spine in a neutral position and bend your knees slightly. Try the following positions:  Lying on your side with a pillow between your knees.  Lying on your back with a pillow under your knees. Lifting    When lifting objects, keep your feet at least shoulder-width apart and tighten your abdominal muscles.  Bend your knees and hips and keep your spine neutral. It  is important to lift using the strength of your legs, not your back. Do not lock your knees straight out.  Always ask for help to lift heavy or awkward objects. This information is not intended to replace advice given to you by your health care provider. Make sure you discuss any questions you have with your health care provider. Document Released: 02/06/2005 Document Revised: 10/14/2015 Document Reviewed: 11/17/2014 Elsevier Interactive Patient Education  2017 [employer].

## 2016-05-31 ENCOUNTER — Telehealth: Payer: Self-pay | Admitting: Family

## 2016-05-31 MED ORDER — CYCLOBENZAPRINE HCL 10 MG PO TABS: 10.0000 mg | ORAL_TABLET | Freq: Three times a day (TID) | ORAL | 3 refills | Status: DC | PRN

## 2016-05-31 NOTE — Telephone Encounter (Signed)
Refill of flexeril Prescription sent to pharmacy

## 2016-05-31 NOTE — Telephone Encounter (Signed)
FYI

## 2016-07-18 ENCOUNTER — Ambulatory Visit: Payer: Medicare Other | Admitting: Cardiovascular Disease

## 2016-07-18 ENCOUNTER — Encounter: Payer: Self-pay | Admitting: Gastroenterology

## 2016-07-27 ENCOUNTER — Ambulatory Visit (INDEPENDENT_AMBULATORY_CARE_PROVIDER_SITE_OTHER): Payer: Medicare Other | Admitting: Family

## 2016-07-27 ENCOUNTER — Encounter: Payer: Self-pay | Admitting: Family

## 2016-07-27 VITALS — BP 146/96 | HR 87 | Temp 97.6°F | Ht 60.5 in | Wt 176.0 lb

## 2016-07-27 DIAGNOSIS — K2100 Gastro-esophageal reflux disease with esophagitis, without bleeding: Secondary | ICD-10-CM

## 2016-07-27 DIAGNOSIS — W57XXXA Bitten or stung by nonvenomous insect and other nonvenomous arthropods, initial encounter: Secondary | ICD-10-CM

## 2016-07-27 DIAGNOSIS — I10 Essential (primary) hypertension: Secondary | ICD-10-CM

## 2016-07-27 DIAGNOSIS — M5440 Lumbago with sciatica, unspecified side: Secondary | ICD-10-CM

## 2016-07-27 DIAGNOSIS — M1990 Unspecified osteoarthritis, unspecified site: Secondary | ICD-10-CM

## 2016-07-27 DIAGNOSIS — M542 Cervicalgia: Secondary | ICD-10-CM | POA: Diagnosis not present

## 2016-07-27 DIAGNOSIS — L255 Unspecified contact dermatitis due to plants, except food: Secondary | ICD-10-CM | POA: Diagnosis not present

## 2016-07-27 DIAGNOSIS — F1120 Opioid dependence, uncomplicated: Secondary | ICD-10-CM

## 2016-07-27 DIAGNOSIS — E663 Overweight: Secondary | ICD-10-CM

## 2016-07-27 DIAGNOSIS — G4700 Insomnia, unspecified: Secondary | ICD-10-CM

## 2016-07-27 DIAGNOSIS — Z0289 Encounter for other administrative examinations: Secondary | ICD-10-CM | POA: Diagnosis not present

## 2016-07-27 DIAGNOSIS — F329 Major depressive disorder, single episode, unspecified: Secondary | ICD-10-CM | POA: Diagnosis not present

## 2016-07-27 DIAGNOSIS — F32A Depression, unspecified: Secondary | ICD-10-CM

## 2016-07-27 DIAGNOSIS — E785 Hyperlipidemia, unspecified: Secondary | ICD-10-CM

## 2016-07-27 DIAGNOSIS — G8929 Other chronic pain: Secondary | ICD-10-CM

## 2016-07-27 DIAGNOSIS — E559 Vitamin D deficiency, unspecified: Secondary | ICD-10-CM | POA: Diagnosis not present

## 2016-07-27 DIAGNOSIS — R110 Nausea: Secondary | ICD-10-CM

## 2016-07-27 MED ORDER — CYCLOBENZAPRINE HCL 10 MG PO TABS: 10.0000 mg | ORAL_TABLET | Freq: Three times a day (TID) | ORAL | 3 refills | Status: AC | PRN

## 2016-07-27 MED ORDER — ATORVASTATIN CALCIUM 40 MG PO TABS: 40.0000 mg | ORAL_TABLET | Freq: Every day | ORAL | 1 refills | Status: AC

## 2016-07-27 MED ORDER — TRIAMCINOLONE ACETONIDE 0.5 % EX OINT: 1.0000 "application " | TOPICAL_OINTMENT | Freq: Two times a day (BID) | CUTANEOUS | 0 refills | Status: AC

## 2016-07-27 MED ORDER — HYDROCODONE-ACETAMINOPHEN 5-325 MG PO TABS: 1.0000 | ORAL_TABLET | Freq: Four times a day (QID) | ORAL | 0 refills | Status: AC | PRN

## 2016-07-27 MED ORDER — ALBUTEROL SULFATE HFA 108 (90 BASE) MCG/ACT IN AERS: INHALATION_SPRAY | RESPIRATORY_TRACT | 1 refills | Status: AC

## 2016-07-27 MED ORDER — HYDROCODONE-ACETAMINOPHEN 7.5-325 MG PO TABS: 1.0000 | ORAL_TABLET | Freq: Four times a day (QID) | ORAL | 0 refills | Status: AC | PRN

## 2016-07-27 MED ORDER — ZOLPIDEM TARTRATE 5 MG PO TABS: 10.0000 mg | ORAL_TABLET | Freq: Every evening | ORAL | 1 refills | Status: DC | PRN

## 2016-07-27 MED ORDER — HYDROCHLOROTHIAZIDE 25 MG PO TABS: ORAL_TABLET | ORAL | 1 refills | Status: AC

## 2016-07-27 MED ORDER — AMLODIPINE BESYLATE 5 MG PO TABS: 5.0000 mg | ORAL_TABLET | Freq: Every day | ORAL | 3 refills | Status: DC

## 2016-07-27 MED ORDER — ONDANSETRON HCL 4 MG PO TABS: 4.0000 mg | ORAL_TABLET | Freq: Three times a day (TID) | ORAL | 1 refills | Status: AC | PRN

## 2016-07-27 MED ORDER — OMEPRAZOLE 40 MG PO CPDR: 40.0000 mg | DELAYED_RELEASE_CAPSULE | Freq: Every day | ORAL | 1 refills | Status: AC

## 2016-07-27 NOTE — Patient Instructions (Signed)

## 2016-07-27 NOTE — Progress Notes (Signed)
Subjective:    Patient ID: Michael Branch, male    DOB: Mar 30, 1955, 61 y.o.   MRN: [id number]  Pt presents to the office today for chronic follow up and pain medication refill. Pt complaining of generalized joint pain and has had about 7l tick bites this year. States he removed a tick on his right nipple last week. PT requesting to be tested for blood work.  PT states he is moving back to [place] in the next month.  Hypertension  This is a chronic problem. The current episode started more than 1 year ago. The problem has been waxing and waning since onset. The problem is uncontrolled. Associated symptoms include neck pain. Pertinent negatives include no peripheral edema or shortness of breath. Risk factors for coronary artery disease include dyslipidemia, obesity and sedentary lifestyle. The current treatment provides mild improvement. There is no history of kidney disease, CAD/MI or heart failure.  Hyperlipidemia  This is a chronic problem. The current episode started more than 1 year ago. The problem is uncontrolled. Recent lipid tests were reviewed and are high. Pertinent negatives include no shortness of breath. Current antihyperlipidemic treatment includes statins. The current treatment provides moderate improvement of lipids. Risk factors for coronary artery disease include a sedentary lifestyle, male sex, hypertension, dyslipidemia and family history.  Neck Pain   This is a chronic problem. The current episode started more than 1 year ago. The problem occurs constantly. The problem has been waxing and waning. The pain is at a severity of 9/10.  Back Pain  This is a chronic problem. The current episode started more than 1 year ago. The problem occurs constantly. The problem has been waxing and waning since onset. The pain is at a severity of 5/10. The pain is moderate.  Insomnia  Primary symptoms: sleep disturbance, difficulty falling asleep.  The current episode started more than one year.  The onset quality is gradual. The problem occurs every several days.      Review of Systems  Constitutional: Positive for fatigue.  Respiratory: Negative for shortness of breath.   Musculoskeletal: Positive for arthralgias, back pain, joint swelling and neck pain.  Psychiatric/Behavioral: Positive for sleep disturbance. The patient has insomnia.   All other systems reviewed and are negative.      Objective:   Physical Exam  Constitutional: He is oriented to person, place, and time. He appears well-developed and well-nourished. No distress.  HENT:  Head: Normocephalic.  Right Ear: External ear normal.  Left Ear: External ear normal.  Nose: Nose normal.  Mouth/Throat: Oropharynx is clear and moist.  Eyes: Pupils are equal, round, and reactive to light. Right eye exhibits no discharge. Left eye exhibits no discharge.  Neck: Normal range of motion. Neck supple. No thyromegaly present.  Cardiovascular: Normal rate, regular rhythm, normal heart sounds and intact distal pulses.   No murmur heard. Pulmonary/Chest: Effort normal and breath sounds normal. No respiratory distress. He has no wheezes.  Abdominal: Soft. Bowel sounds are normal. He exhibits no distension. There is no tenderness.  Musculoskeletal: Normal range of motion. He exhibits no edema or tenderness.  Neurological: He is alert and oriented to person, place, and time.  Skin: Skin is warm and dry. No rash noted. No erythema.  Psychiatric: He has a normal mood and affect. His behavior is normal. Judgment and thought content normal.  Vitals reviewed.    BP (!) 146/96   Pulse 87   Temp 97.6 F (36.4 C) (Oral)   Ht 5'  0.5" (1.537 m)   Wt 176 lb (79.8 kg)   BMI 33.81 kg/m      Assessment & Plan:  1. Essential hypertension - hydrochlorothiazide (HYDRODIURIL) 25 MG tablet; TAKE 1 TABLET (25 MG TOTAL) BY MOUTH DAILY.  Dispense: 90 tablet; Refill: 1 - amLODipine (NORVASC) 5 MG tablet; Take 1 tablet (5 mg total) by mouth  daily.  Dispense: 90 tablet; Refill: 3 - CMP14+EGFR  2. Arthritis - HYDROcodone-acetaminophen (NORCO/VICODIN) 5-325 MG tablet; Take 1 tablet by mouth every 6 (six) hours as needed for moderate pain.  Dispense: 90 tablet; Refill: 0 - HYDROcodone-acetaminophen (NORCO) 7.5-325 MG tablet; Take 1 tablet by mouth every 6 (six) hours as needed for moderate pain.  Dispense: 90 tablet; Refill: 0 - HYDROcodone-acetaminophen (NORCO) 7.5-325 MG tablet; Take 1 tablet by mouth every 6 (six) hours as needed for moderate pain.  Dispense: 90 tablet; Refill: 0 - CMP14+EGFR  3. Vitamin D deficiency - CMP14+EGFR  4. Pain medication agreement signed - HYDROcodone-acetaminophen (NORCO/VICODIN) 5-325 MG tablet; Take 1 tablet by mouth every 6 (six) hours as needed for moderate pain.  Dispense: 90 tablet; Refill: 0 - HYDROcodone-acetaminophen (NORCO) 7.5-325 MG tablet; Take 1 tablet by mouth every 6 (six) hours as needed for moderate pain.  Dispense: 90 tablet; Refill: 0 - HYDROcodone-acetaminophen (NORCO) 7.5-325 MG tablet; Take 1 tablet by mouth every 6 (six) hours as needed for moderate pain.  Dispense: 90 tablet; Refill: 0 - CMP14+EGFR  5. Overweight (BMI 25.0-29.9) - CMP14+EGFR  6. Uncomplicated opioid dependence ([place]) - HYDROcodone-acetaminophen (NORCO) 7.5-325 MG tablet; Take 1 tablet by mouth every 6 (six) hours as needed for moderate pain.  Dispense: 90 tablet; Refill: 0 - CMP14+EGFR  7. Insomnia, unspecified type - zolpidem (AMBIEN) 5 MG tablet; Take 2 tablets (10 mg total) by mouth at bedtime as needed for sleep.  Dispense: 180 tablet; Refill: 1 - CMP14+EGFR  8. Depression, unspecified depression type - CMP14+EGFR  9. Chronic bilateral low back pain with sciatica, sciatica laterality unspecified - HYDROcodone-acetaminophen (NORCO/VICODIN) 5-325 MG tablet; Take 1 tablet by mouth every 6 (six) hours as needed for moderate pain.  Dispense: 90 tablet; Refill: 0 - HYDROcodone-acetaminophen (NORCO)  7.5-325 MG tablet; Take 1 tablet by mouth every 6 (six) hours as needed for moderate pain.  Dispense: 90 tablet; Refill: 0 - HYDROcodone-acetaminophen (NORCO) 7.5-325 MG tablet; Take 1 tablet by mouth every 6 (six) hours as needed for moderate pain.  Dispense: 90 tablet; Refill: 0 - CMP14+EGFR  10. Chronic neck pain - HYDROcodone-acetaminophen (NORCO/VICODIN) 5-325 MG tablet; Take 1 tablet by mouth every 6 (six) hours as needed for moderate pain.  Dispense: 90 tablet; Refill: 0 - HYDROcodone-acetaminophen (NORCO) 7.5-325 MG tablet; Take 1 tablet by mouth every 6 (six) hours as needed for moderate pain.  Dispense: 90 tablet; Refill: 0 - HYDROcodone-acetaminophen (NORCO) 7.5-325 MG tablet; Take 1 tablet by mouth every 6 (six) hours as needed for moderate pain.  Dispense: 90 tablet; Refill: 0 - CMP14+EGFR  11. Hyperlipidemia, unspecified hyperlipidemia type - atorvastatin (LIPITOR) 40 MG tablet; Take 1 tablet (40 mg total) by mouth daily.  Dispense: 90 tablet; Refill: 1 - CMP14+EGFR - Lipid panel  12. Contact dermatitis due to plant - triamcinolone ointment (KENALOG) 0.5 %; Apply 1 application topically 2 (two) times daily.  Dispense: 30 g; Refill: 0 - CMP14+EGFR  13. Nausea without vomiting - omeprazole (PRILOSEC) 40 MG capsule; Take 1 capsule (40 mg total) by mouth daily.  Dispense: 90 capsule; Refill: 1 - CMP14+EGFR  14. Gastroesophageal  reflux disease with esophagitis - omeprazole (PRILOSEC) 40 MG capsule; Take 1 capsule (40 mg total) by mouth daily.  Dispense: 90 capsule; Refill: 1 - CMP14+EGFR  15. Tick bite, initial encounter - CMP14+EGFR - [place] mtn spotted fvr abs pnl(IgG+IgM) - Lyme Ab/Western Blot Reflex  [facility] controlled database reviewed- Pt has only received controlled medications from me.   Continue all meds Labs pending Health Maintenance reviewed Diet and exercise encouraged RTO 3 months   Evelina Dun, FNP

## 2016-07-31 ENCOUNTER — Telehealth: Payer: Self-pay | Admitting: Family

## 2016-07-31 ENCOUNTER — Ambulatory Visit: Payer: Medicare Other | Admitting: Family

## 2016-07-31 LAB — CMP14+EGFR
A/G RATIO: 2 (ref 1.2–2.2)
ALBUMIN: 4.7 g/dL (ref 3.6–4.8)
ALK PHOS: 92 IU/L (ref 39–117)
ALT: 59 IU/L — AB (ref 0–44)
AST: 32 IU/L (ref 0–40)
BILIRUBIN TOTAL: 0.7 mg/dL (ref 0.0–1.2)
BUN / CREAT RATIO: 12 (ref 10–24)
BUN: 13 mg/dL (ref 8–27)
CHLORIDE: 99 mmol/L (ref 96–106)
CO2: 27 mmol/L (ref 18–29)
Calcium: 9.8 mg/dL (ref 8.6–10.2)
Creatinine, Ser: 1.13 mg/dL (ref 0.76–1.27)
GFR calc Af Amer: 81 mL/min/{1.73_m2} (ref >59)
GFR calc non Af Amer: 70 mL/min/{1.73_m2} (ref >59)
Globulin, Total: 2.4 g/dL (ref 1.5–4.5)
Glucose: 93 mg/dL (ref 65–99)
POTASSIUM: 3.6 mmol/L (ref 3.5–5.2)
Sodium: 142 mmol/L (ref 134–144)
TOTAL PROTEIN: 7.1 g/dL (ref 6.0–8.5)

## 2016-07-31 LAB — ROCKY MTN SPOTTED FVR ABS PNL(IGG+IGM)
RMSF IgG: POSITIVE — AB
RMSF IgM: 0.42 index (ref 0.00–0.89)

## 2016-07-31 LAB — LIPID PANEL
CHOLESTEROL TOTAL: 161 mg/dL (ref 100–199)
Chol/HDL Ratio: 2.5 ratio (ref 0.0–5.0)
HDL: 65 mg/dL (ref >39)
LDL Calculated: 62 mg/dL (ref 0–99)
TRIGLYCERIDES: 169 mg/dL — AB (ref 0–149)
VLDL CHOLESTEROL CAL: 34 mg/dL (ref 5–40)

## 2016-07-31 LAB — LYME AB/WESTERN BLOT REFLEX: Lyme IgG/IgM Ab: <0.91 {ISR} (ref 0.00–0.90)

## 2016-07-31 LAB — RMSF, IGG, IFA

## 2016-07-31 MED ORDER — ZOLPIDEM TARTRATE 10 MG PO TABS: 10.0000 mg | ORAL_TABLET | Freq: Every evening | ORAL | 5 refills | Status: DC | PRN

## 2016-07-31 NOTE — Telephone Encounter (Signed)
Pt notified of RX change

## 2016-07-31 NOTE — Telephone Encounter (Signed)
RX changed 

## 2016-08-08 ENCOUNTER — Telehealth: Payer: Self-pay | Admitting: Family

## 2016-08-08 NOTE — Telephone Encounter (Signed)
Notes faxed to Anna Genre at [facility].

## 2016-08-10 ENCOUNTER — Encounter: Payer: Self-pay | Admitting: Family

## 2016-08-10 ENCOUNTER — Ambulatory Visit (INDEPENDENT_AMBULATORY_CARE_PROVIDER_SITE_OTHER): Payer: Medicare Other | Admitting: Family

## 2016-08-10 VITALS — BP 137/85 | HR 80 | Temp 97.1°F | Ht 60.5 in | Wt 176.0 lb

## 2016-08-10 DIAGNOSIS — G8929 Other chronic pain: Secondary | ICD-10-CM | POA: Diagnosis not present

## 2016-08-10 DIAGNOSIS — G6289 Other specified polyneuropathies: Secondary | ICD-10-CM | POA: Diagnosis not present

## 2016-08-10 DIAGNOSIS — G4700 Insomnia, unspecified: Secondary | ICD-10-CM

## 2016-08-10 DIAGNOSIS — M5441 Lumbago with sciatica, right side: Secondary | ICD-10-CM | POA: Diagnosis not present

## 2016-08-10 MED ORDER — METHYLPREDNISOLONE ACETATE 80 MG/ML IJ SUSP
80.0000 mg | Freq: Once | INTRAMUSCULAR | Status: AC
  Administered 2016-08-10: 80 mg via INTRAMUSCULAR

## 2016-08-10 MED ORDER — ZOLPIDEM TARTRATE 10 MG PO TABS: 10.0000 mg | ORAL_TABLET | Freq: Every evening | ORAL | 5 refills | Status: AC | PRN

## 2016-08-10 MED ORDER — KETOROLAC TROMETHAMINE 60 MG/2ML IM SOLN
60.0000 mg | Freq: Once | INTRAMUSCULAR | Status: AC
  Administered 2016-08-10: 60 mg via INTRAMUSCULAR

## 2016-08-10 MED ORDER — DULOXETINE HCL 30 MG PO CPEP: 30.0000 mg | ORAL_CAPSULE | Freq: Every day | ORAL | 1 refills | Status: AC

## 2016-08-10 NOTE — Progress Notes (Signed)
Subjective:    Patient ID: Michael Branch, male    DOB: 12/20/1955, 61 y.o.   MRN: [id number]  Back Pain  This is a recurrent problem. The current episode started in the past 7 days. The problem occurs constantly. The problem has been waxing and waning since onset. The pain is present in the lumbar spine. The quality of the pain is described as aching. The pain radiates to the right foot and right thigh. The pain is at a severity of 8/10. The pain is moderate. The symptoms are aggravated by coughing and standing. Associated symptoms include leg pain, numbness and tingling. Pertinent negatives include no bladder incontinence or bowel incontinence. He has tried bed rest, analgesics, muscle relaxant and NSAIDs for the symptoms. The treatment provided mild relief.  Insomnia PT was taking two tabs of  Ambien 5 mg, but insurance would not pay for this and it was changed to one tablet Ambien 10 mg. Pt states he needs this reprinted today because the pharmacy would not fill it yet.  Peripheral Neuropathy Pt reports bilateral numbness, tingling, and shooting pain  in bilateral feet. PT has tried gabapentin and Lyric, but could not tolerate.    Review of Systems  Gastrointestinal: Negative for bowel incontinence.  Genitourinary: Negative for bladder incontinence.  Musculoskeletal: Positive for back pain.  Neurological: Positive for tingling and numbness.  All other systems reviewed and are negative.      Objective:   Physical Exam  Constitutional: He is oriented to person, place, and time. He appears well-developed and well-nourished. No distress.  HENT:  Head: Normocephalic.  Right Ear: External ear normal.  Left Ear: External ear normal.  Mouth/Throat: Oropharynx is clear and moist.  Eyes: Pupils are equal, round, and reactive to light. Right eye exhibits no discharge. Left eye exhibits no discharge.  Neck: Normal range of motion. Neck supple. No thyromegaly present.  Cardiovascular: Normal  rate, regular rhythm, normal heart sounds and intact distal pulses.   No murmur heard. Pulmonary/Chest: Effort normal and breath sounds normal. No respiratory distress. He has no wheezes.  Abdominal: Soft. Bowel sounds are normal. He exhibits no distension. There is no tenderness.  Musculoskeletal: Normal range of motion. He exhibits no edema or tenderness.  Negative SLR, pain in lower back with flexion  Neurological: He is alert and oriented to person, place, and time.  Skin: Skin is warm and dry. No rash noted. No erythema.  Psychiatric: He has a normal mood and affect. His behavior is normal. Judgment and thought content normal.  Vitals reviewed.     BP 137/85   Pulse 80   Temp 97.1 F (36.2 C) (Oral)   Ht 5' 0.5" (1.537 m)   Wt 176 lb (79.8 kg)   BMI 33.81 kg/m      Assessment & Plan:  1. Chronic right-sided low back pain with right-sided sciatica Rest ROM exercises discussed Continue medications  - methylPREDNISolone acetate (DEPO-MEDROL) injection 80 mg; Inject 1 mL (80 mg total) into the muscle once. - ketorolac (TORADOL) injection 60 mg; Inject 2 mLs (60 mg total) into the muscle once.  2. Insomnia, unspecified type Will give new script of ambien 10 mg today Sleep ritual disucssed - zolpidem (AMBIEN) 10 MG tablet; Take 1 tablet (10 mg total) by mouth at bedtime as needed for sleep.  Dispense: 30 tablet; Refill: 5  3. Other polyneuropathy Will try Cymbalta 30 mg today - DULoxetine (CYMBALTA) 30 MG capsule; Take 1 capsule (30 mg total) by  mouth daily.  Dispense: 90 capsule; Refill: [facility], FNP

## 2016-08-10 NOTE — Patient Instructions (Signed)
Spondylolisthesis Rehab  Ask your health care provider which exercises are safe for you. Do exercises exactly as told by your health care provider and adjust them as directed. It is normal to feel mild stretching, pulling, tightness, or discomfort as you do these exercises, but you should stop right away if you feel sudden pain or your pain gets worse. Do not begin these exercises until told by your health care provider.  Stretching and range of motion exercises  These exercises warm up your muscles and joints and improve the movement and flexibility of your hips and your back. These exercises may also help to relieve pain, numbness, and tingling.  Exercise A: Single knee to chest    1. Lie on your back on a firm surface with both legs straight.  2. Bend one of your knees. Use your hands to move your knee up toward your chest until you feel a gentle stretch in your lower back and buttock.  ? Hold your leg in this position by holding onto the front of your knee.  ? Keep your other leg as straight as possible.  3. Hold for __________ seconds.  4. Slowly return to the starting position.  5. Repeat this exercise with your other leg.  Repeat __________ times. Complete this exercise __________ times a day.  Exercise B: Double knee to chest    1. Lie on your back on a firm surface with both legs straight.  2. Bend one of your knees and move it toward your chest until you feel a gentle stretch in your lower back and buttock.  3. Tense your abdominal muscles and repeat the previous step with your other leg.  4. Hold both of your legs in this position by holding onto the backs of your thighs or the fronts of your knees.  5. Hold for __________ seconds.  6. Tense your abdominal muscles and slowly move your legs back to the floor, one leg at a time.  Repeat __________ times. Complete this exercise __________ times a day.  Strengthening exercises  These exercises build strength and endurance in your back. Endurance is the  ability to use your muscles for a long time, even after they get tired.  Exercise C: Pelvic tilt  1. Lie on your back on a firm bed or the floor. Bend your knees and keep your feet flat.  2. Tense your abdominal muscles. Tip your pelvis up toward the ceiling and flatten your lower back into the floor.  ? To help with this exercise, you may place a small towel under your lower back and try to push your back into the towel.  3. Hold for __________ seconds.  4. Let your muscles relax completely before you repeat this exercise.  Repeat __________ times. Complete this exercise __________ times a day.  Exercise D: Abdominal crunch    1. Lie on your back on a firm surface. Bend your knees and keep your feet flat. Cross your arms over your chest.  2. Tuck your chin down toward your chest, without bending your neck.  3. Use your abdominal muscles to lift your upper body off of the ground, straight up into the air.  ? Try to lift yourself until your shoulder blades are off the ground. You may need to work up to this.  ? Keep your lower back on the ground while you crunch upward.  ? Do not hold your breath.  4. Slowly lower yourself down. Keep your abdominal muscles tense until   you are back to the starting position.  Repeat __________ times. Complete this exercise __________ times a day.  Exercise E: Alternating arm and leg raises    1. Get on your hands and knees on a firm surface. If you are on a hard floor, you may want to use padding to cushion your knees, such as an exercise mat.  2. Line up your arms and legs. Your hands should be below your shoulders, and your knees should be below your hips.  3. Lift your left leg behind you. At the same time, raise your right arm and straighten it in front of you.  ? Do not lift your leg higher than your hip.  ? Do not lift your arm higher than your shoulder.  ? Keep your abdominal and back muscles tight.  ? Keep your hips facing the ground.  ? Do not arch your back.  ? Keep your  balance carefully, and do not hold your breath.  4. Hold for __________ seconds.  5. Slowly return to the starting position and repeat with your right leg and your left arm.  Repeat __________ times. Complete this exercise __________ times a day.  Posture and body mechanics    Body mechanics refers to the movements and positions of your body while you do your daily activities. Posture is part of body mechanics. Good posture and healthy body mechanics can help to relieve stress in your body's tissues and joints. Good posture means that your spine is in its natural S-curve position (your spine is neutral), your shoulders are pulled back slightly, and your head is not tipped forward. The following are general guidelines for applying improved posture and body mechanics to your everyday activities.  Standing     When standing, keep your spine neutral and your feet about hip-width apart. Keep a slight bend in your knees. Your ears, shoulders, and hips should line up.   When you do a task in which you stand in one place for a long time, place one foot up on a stable object that is 2-4 inches (5-10 cm) high, such as a footstool. This helps keep your spine neutral.  Sitting     When sitting, keep your spine neutral and keep your feet flat on the floor. Use a footrest, if necessary, and keep your thighs parallel to the floor. Avoid rounding your shoulders, and avoid tilting your head forward.   When working at a desk or a computer, keep your desk at a height where your hands are slightly lower than your elbows. Slide your chair under your desk so you are close enough to maintain good posture.   When working at a computer, place your monitor at a height where you are looking straight ahead and you do not have to tilt your head forward or downward to look at the screen.  Resting    When lying down and resting, avoid positions that are most painful for you.   If you have pain with activities such as sitting, bending,  stooping, or squatting (flexion-based activities), lie in a position in which your body does not bend very much. For example, avoid curling up on your side with your arms and knees near your chest (fetal position).   If you have pain with activities such as standing for a long time or reaching with your arms (extension-based activities), lie with your spine in a neutral position and bend your knees slightly. Try the following positions:  ? Lying on   your side with a pillow between your knees.  ? Lying on your back with a pillow under your knees.    Lifting     When lifting objects, keep your feet at least shoulder-width apart and tighten your abdominal muscles.   Bend your knees and hips and keep your spine neutral. It is important to lift using the strength of your legs, not your back. Do not lock your knees straight out.   Always ask for help to lift heavy or awkward objects.  This information is not intended to replace advice given to you by your health care provider. Make sure you discuss any questions you have with your health care provider.  Document Released: 02/06/2005 Document Revised: 10/14/2015 Document Reviewed: 11/17/2014  Elsevier Interactive Patient Education  2018 [employer]

## 2016-08-16 ENCOUNTER — Other Ambulatory Visit: Payer: Self-pay | Admitting: Family

## 2016-08-16 DIAGNOSIS — I10 Essential (primary) hypertension: Secondary | ICD-10-CM

## 2016-08-24 ENCOUNTER — Encounter: Payer: Self-pay | Admitting: Nurse Practitioner

## 2016-08-24 ENCOUNTER — Telehealth: Payer: Self-pay | Admitting: Family

## 2016-08-24 ENCOUNTER — Ambulatory Visit (INDEPENDENT_AMBULATORY_CARE_PROVIDER_SITE_OTHER): Payer: Medicare Other | Admitting: Nurse Practitioner

## 2016-08-24 VITALS — BP 141/96 | HR 88 | Temp 97.8°F | Ht 60.0 in | Wt 174.0 lb

## 2016-08-24 DIAGNOSIS — Z7689 Persons encountering health services in other specified circumstances: Secondary | ICD-10-CM | POA: Diagnosis not present

## 2016-08-24 DIAGNOSIS — G8929 Other chronic pain: Secondary | ICD-10-CM

## 2016-08-24 DIAGNOSIS — W57XXXD Bitten or stung by nonvenomous insect and other nonvenomous arthropods, subsequent encounter: Secondary | ICD-10-CM | POA: Diagnosis not present

## 2016-08-24 DIAGNOSIS — M5441 Lumbago with sciatica, right side: Secondary | ICD-10-CM | POA: Diagnosis not present

## 2016-08-24 MED ORDER — METHYLPREDNISOLONE ACETATE 80 MG/ML IJ SUSP
80.0000 mg | Freq: Once | INTRAMUSCULAR | Status: AC
  Administered 2016-08-24: 80 mg via INTRAMUSCULAR

## 2016-08-24 NOTE — Progress Notes (Signed)
   Subjective:    Patient ID: Michael Branch, male    DOB: [birth year], 61 y.o.   MRN: [id number]  HPI Patient comes into the office to for complaints of nausea following eating red meat for about 1 month.  He states he has been bitten by several ticks so far this year.  He has not yet been treated for any tick-borne illnesses.  He feels like he has had much less energy for the last 2-3 months.  He also complains of chronic pain and states his legs are hurting all the time.  He sees Evelina Dun, NP for pain management and his pain medication was refilled on 07/27/16.  Patient is interested in a steroid injection today if possible.   Review of Systems  Constitutional: Positive for activity change (x3 months), appetite change (x2-3 months) and fatigue.  HENT: Negative.   Respiratory: Positive for shortness of breath and wheezing (possible emphysema per pulmonologist).   Cardiovascular: Negative for chest pain and palpitations.  Gastrointestinal: Positive for nausea.  Musculoskeletal: Positive for back pain (chronic) and joint swelling (chronic).  All other systems reviewed and are negative.      Objective:   Physical Exam  Constitutional: He is oriented to person, place, and time. He appears well-developed and well-nourished. No distress.  HENT:  Head: Normocephalic and atraumatic.  Eyes: Pupils are equal, round, and reactive to light.  Neck: Normal range of motion. Neck supple. No JVD present. No thyromegaly present.  Cardiovascular: Normal rate, regular rhythm, normal heart sounds and intact distal pulses.   No murmur heard. Pulmonary/Chest: Effort normal and breath sounds normal. No respiratory distress.  Abdominal: Soft. Bowel sounds are normal. He exhibits no distension. There is no tenderness.  Musculoskeletal: Normal range of motion.  Lymphadenopathy:    He has no cervical adenopathy.  Neurological: He is alert and oriented to person, place, and time.  Skin: Skin is warm and  dry.  Psychiatric: He has a normal mood and affect. His behavior is normal. Judgment and thought content normal.   BP (!) 141/96   Pulse 88   Temp 97.8 F (36.6 C) (Oral)   Ht 5' (1.524 m)   Wt 174 lb (78.9 kg)   BMI 33.98 kg/m      Assessment & Plan:  1. Tick bite, subsequent encounter Patient says has reactions when eats red meat - Alpha-Gal Panel  2. Chronic right-sided low back pain with right-sided sciatica Moist heat to back No heavy lifting Must se PCP for pain meds - methylPREDNISolone acetate (DEPO-MEDROL) injection 80 mg; Inject 1 mL (80 mg total) into the muscle once.   Mary-Margaret Hassell Done, FNP

## 2016-08-24 NOTE — Patient Instructions (Signed)
Sciatica Sciatica is pain, numbness, weakness, or tingling along your sciatic nerve. The sciatic nerve starts in the lower back and goes down the back of each leg. Sciatica happens when this nerve is pinched or has pressure put on it. Sciatica usually goes away on its own or with treatment. Sometimes, sciatica may keep coming back (recur). Follow these instructions at home: Medicines  Take over-the-counter and prescription medicines only as told by your doctor.  Do not drive or use heavy machinery while taking prescription pain medicine. Managing pain  If directed, put ice on the affected area. ? Put ice in a plastic bag. ? Place a towel between your skin and the bag. ? Leave the ice on for 20 minutes, 2-3 times a day.  After icing, apply heat to the affected area before you exercise or as often as told by your doctor. Use the heat source that your doctor tells you to use, such as a moist heat pack or a heating pad. ? Place a towel between your skin and the heat source. ? Leave the heat on for 20-30 minutes. ? Remove the heat if your skin turns bright red. This is especially important if you are unable to feel pain, heat, or cold. You may have a greater risk of getting burned. Activity  Return to your normal activities as told by your doctor. Ask your doctor what activities are safe for you. ? Avoid activities that make your sciatica worse.  Take short rests during the day. Rest in a lying or standing position. This is usually better than sitting to rest. ? When you rest for a long time, do some physical activity or stretching between periods of rest. ? Avoid sitting for a long time without moving. Get up and move around at least one time each hour.  Exercise and stretch regularly, as told by your doctor.  Do not lift anything that is heavier than 10 lb (4.5 kg) while you have symptoms of sciatica. ? Avoid lifting heavy things even when you do not have symptoms. ? Avoid lifting heavy  things over and over.  When you lift objects, always lift in a way that is safe for your body. To do this, you should: ? Bend your knees. ? Keep the object close to your body. ? Avoid twisting. General instructions  Use good posture. ? Avoid leaning forward when you are sitting. ? Avoid hunching over when you are standing.  Stay at a healthy weight.  Wear comfortable shoes that support your feet. Avoid wearing high heels.  Avoid sleeping on a mattress that is too soft or too hard. You might have less pain if you sleep on a mattress that is firm enough to support your back.  Keep all follow-up visits as told by your doctor. This is important. Contact a doctor if:  You have pain that: ? Wakes you up when you are sleeping. ? Gets worse when you lie down. ? Is worse than the pain you have had in the past. ? Lasts longer than 4 weeks.  You lose weight for without trying. Get help right away if:  You cannot control when you pee (urinate) or poop (have a bowel movement).  You have weakness in any of these areas and it gets worse. ? Lower back. ? Lower belly (pelvis). ? Butt (buttocks). ? Legs.  You have redness or swelling of your back.  You have a burning feeling when you pee. This information is not intended to replace   advice given to you by your health care provider. Make sure you discuss any questions you have with your health care provider. Document Released: 11/16/2007 Document Revised: 07/15/2015 Document Reviewed: 10/16/2014 Elsevier Interactive Patient Education  2018 [employer]  

## 2016-08-24 NOTE — Telephone Encounter (Signed)
appt scheduled for evaluation 

## 2016-08-29 ENCOUNTER — Telehealth: Payer: Self-pay | Admitting: *Deleted

## 2016-08-29 LAB — ALPHA-GAL PANEL
ALPHA GAL IGE: 12.4 kU/L — AB (ref <0.35)
BEEF (BOS SPP) IGE: 3.54 kU/L — AB (ref <0.35)
Class Interpretation: 1
Class Interpretation: 2
Class Interpretation: 3
LAMB/MUTTON (OVIS SPP) IGE: 0.67 kU/L — AB (ref <0.35)
Pork (Sus spp) IgE: 1.03 kU/L — ABNORMAL HIGH (ref <0.35)

## 2016-08-29 NOTE — Telephone Encounter (Signed)
Pt notified of results Verbalizes understanding 

## 2016-08-29 NOTE — Telephone Encounter (Signed)
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 Avoid all red meat- can only eat chicken and [place]- may resolve in 6 months to one year but not always

## 2016-09-01 ENCOUNTER — Other Ambulatory Visit: Payer: Self-pay | Admitting: Family

## 2016-09-01 DIAGNOSIS — E785 Hyperlipidemia, unspecified: Secondary | ICD-10-CM

## 2016-09-01 DIAGNOSIS — I10 Essential (primary) hypertension: Secondary | ICD-10-CM

## 2016-09-26 ENCOUNTER — Telehealth: Payer: Self-pay | Admitting: Family

## 2016-09-26 NOTE — Telephone Encounter (Signed)
Pt called and states the pharmacy in [place] won't fill his last rx for hydrocodone since it is older than 25 days old. Advised we can't call in Hydrocodone and he says his appt with new doctor isn't until the end of the month. Advised he could go to the ED. Is there anything else that can be done?

## 2016-09-28 NOTE — Telephone Encounter (Signed)
Left message with provider's information.

## 2016-09-28 NOTE — Telephone Encounter (Signed)
Pt will need to get appt to be seen there. We can not "call in" or mail controlled medications. Sorry

## 2016-11-28 ENCOUNTER — Telehealth: Payer: Self-pay | Admitting: Family

## 2016-11-30 NOTE — Telephone Encounter (Signed)
Spoke with office and gave them Michael Branch tel # to contact and get Colonoscopy performed by them.

## 2016-12-27 ENCOUNTER — Telehealth: Payer: Self-pay

## 2016-12-27 NOTE — Telephone Encounter (Signed)
-----   Message from Yetta Flock, MD sent at 12/25/2016  4:36 PM EST ----- Regarding: follow up flex sig, overdue Almyra Free this patient is overdue for a flex sig to evaluate large polypectomy site. Can you contact him for scheduling? Thanks

## 2016-12-27 NOTE — Telephone Encounter (Signed)
Left message for patient that he is overdue for follow up flex sig, asked him to contact our office to get this scheduled. If he has questions or concerns, instructed to ask for me.

## 2018-06-10 IMAGING — US US ABDOMEN COMPLETE
1 series · 13 of 25 positions shown · non-contrast
Comparison: None.

CLINICAL DATA: Elevated liver enzymes.

EXAM:
ABDOMEN ULTRASOUND COMPLETE

[Series 1: us abdomen complete · 0.19mm/px · 13 of 106 slices shown]
[im 1/106]
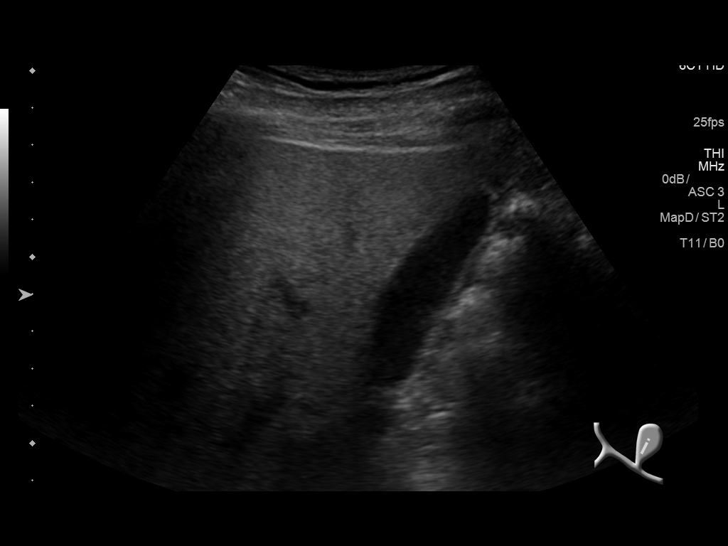
[im 9/106]
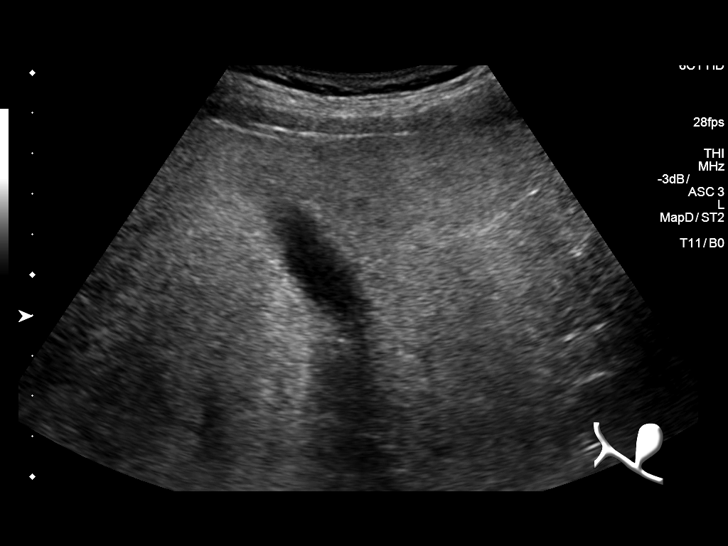
[im 18/106]
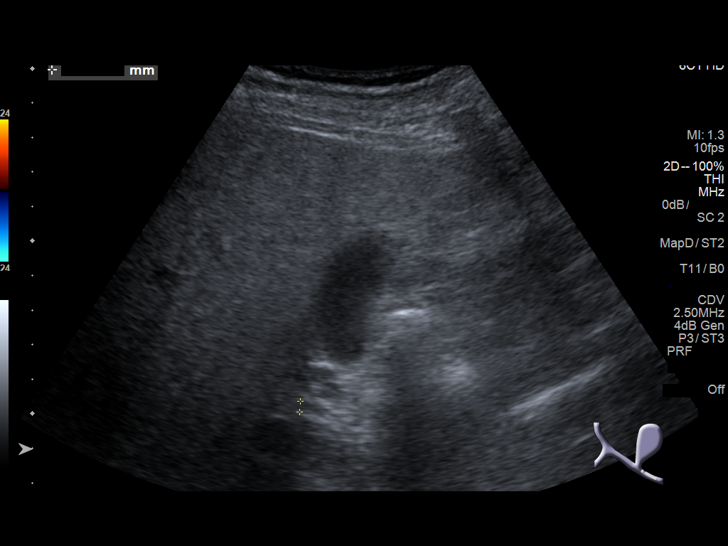
[im 27/106]
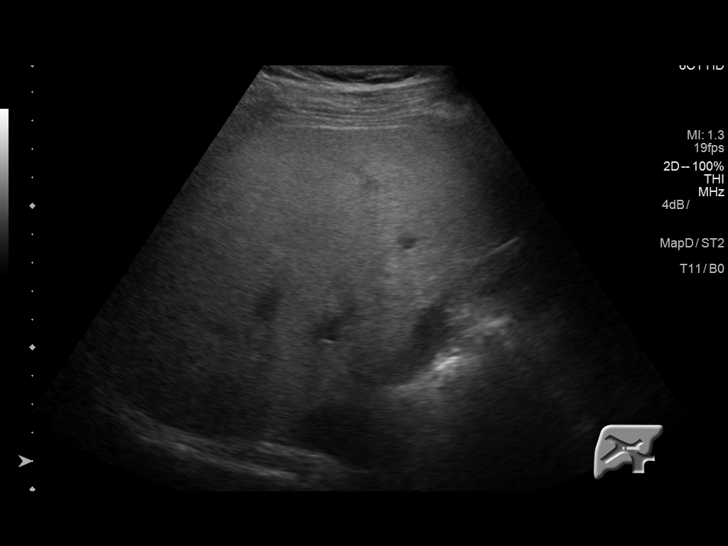
[im 36/106]
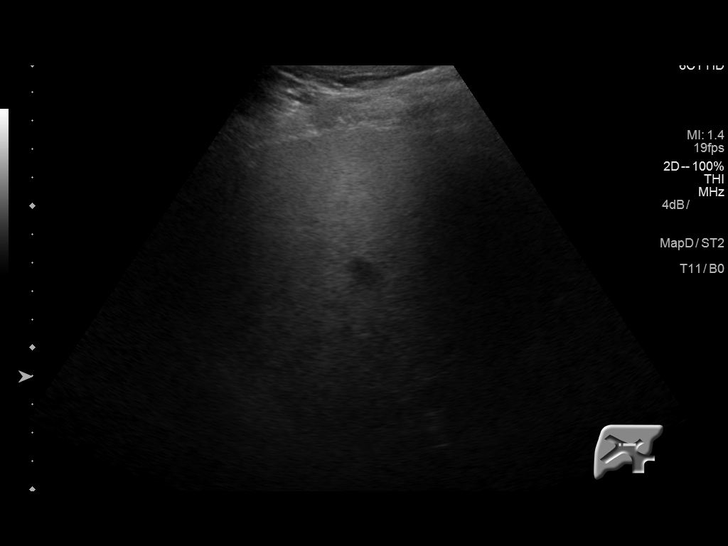
[im 44/106]
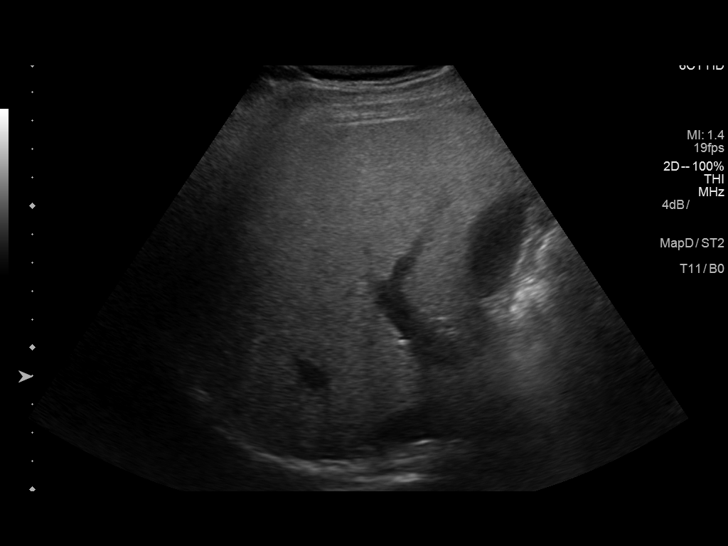
[im 53/106]
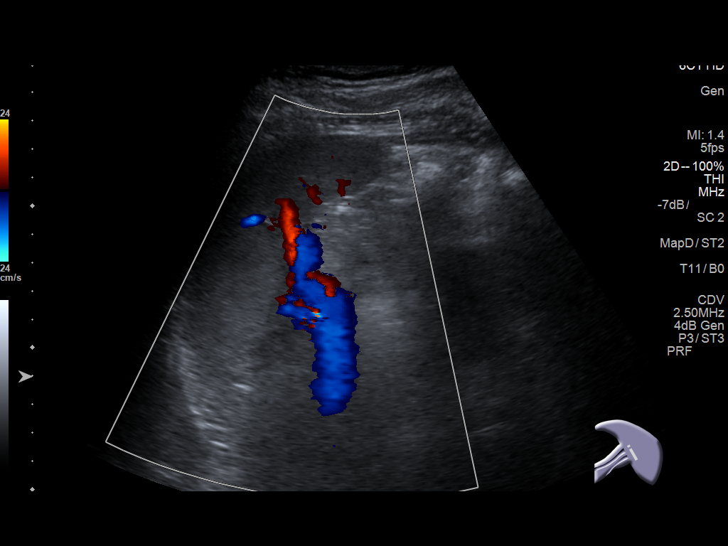
[im 62/106]
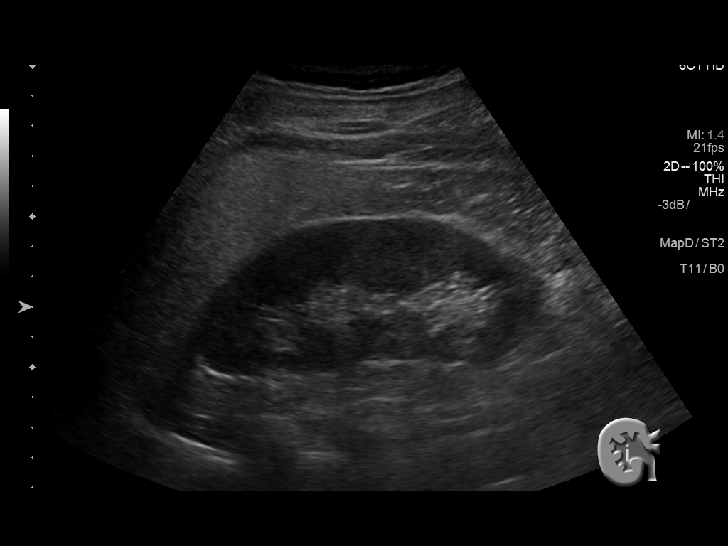
[im 71/106]
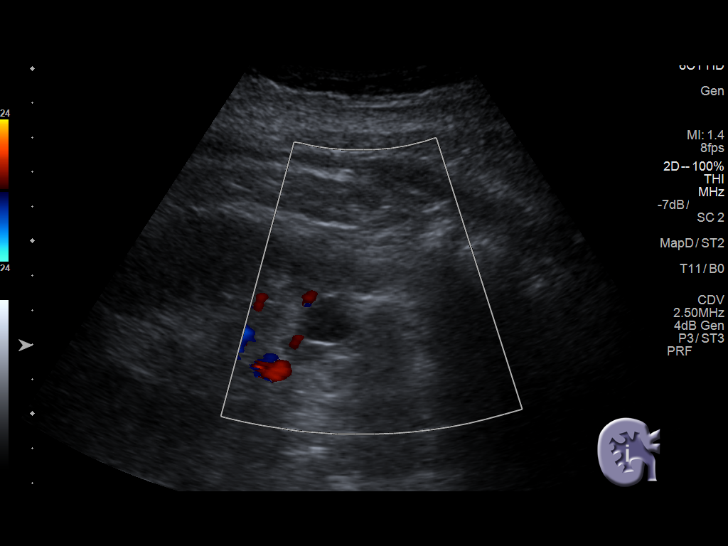
[im 79/106]
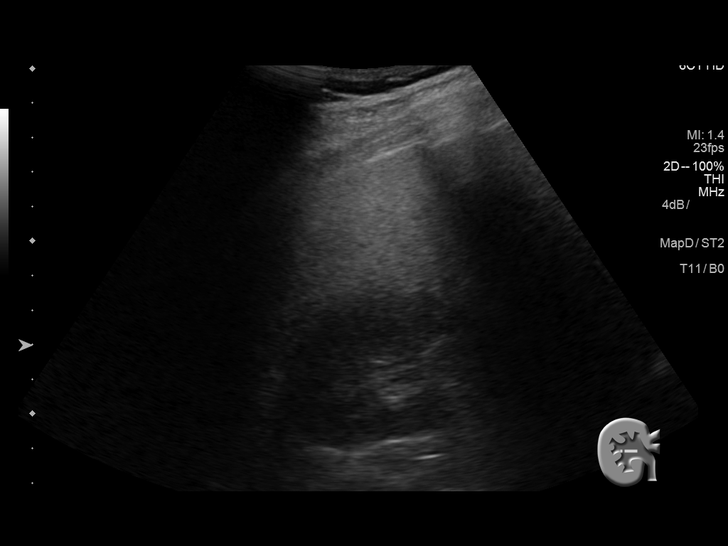
[im 88/106]
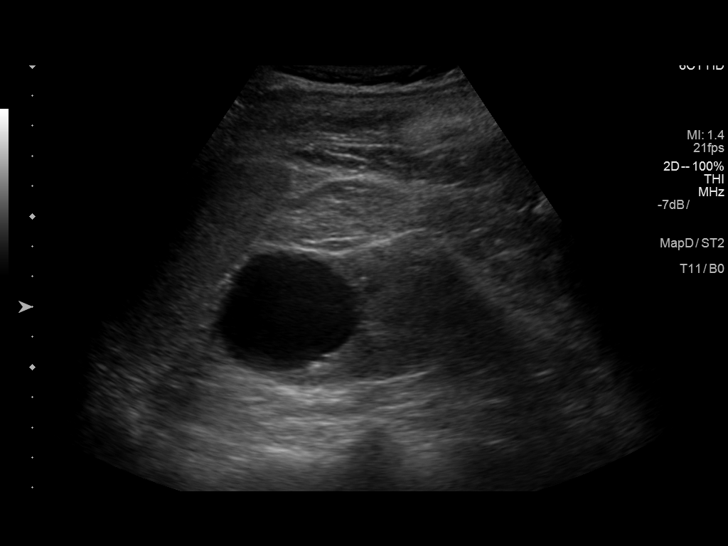
[im 97/106]
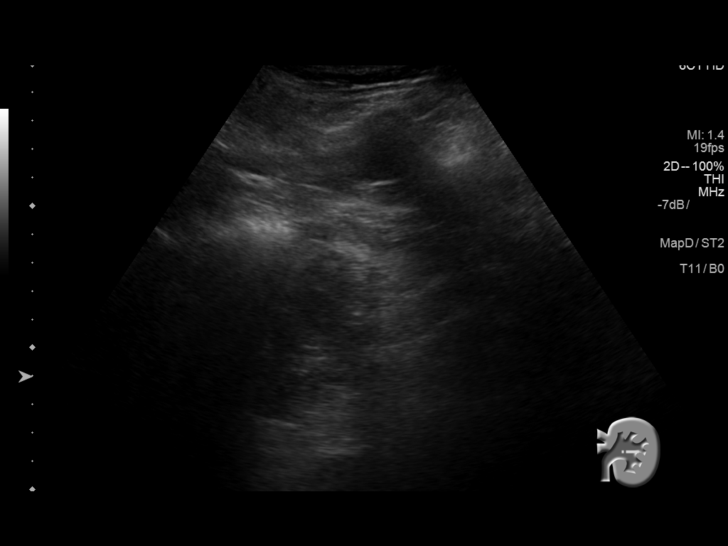
[im 106/106]
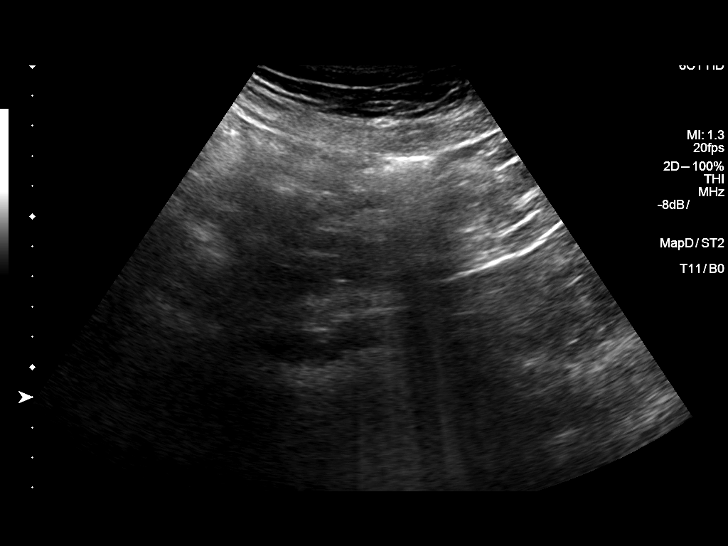

[13 of 25 positions shown; findings below may reference images not displayed]

FINDINGS: Gallbladder: No gallstones or wall thickening visualized. No
sonographic Murphy sign noted by sonographer.

Common bile duct: Diameter: 3.1 mm

Liver: Diffuse increased parenchymal echogenicity compatible with
hepatic steatosis. No focal lesion.

IVC: No abnormality visualized.

Pancreas: Visualized portion unremarkable.

Spleen: Size and appearance within normal limits.

Right Kidney: Length: 11.7 cm. There are several cysts noted within
the inferior pole of the left kidney. The largest measures 1.4 cm.
Echogenicity within normal limits. No mass or hydronephrosis
visualized.

Left Kidney: Length: 11.5 cm. Simple appearing upper pole cyst
measures 4.9 cm in maximum dimension. Echogenicity within normal
limits. No mass or hydronephrosis visualized.

Abdominal aorta: Measures 2.7 cm.

Other findings: None.
IMPRESSION: 1. Echogenic liver compatible with hepatic steatosis.
2. Kidney cysts
3. Ectatic abdominal aorta. Ectatic abdominal aorta at risk for
aneurysm development. Recommend followup by ultrasound in 5 years.
This recommendation follows ACR consensus guidelines: White Paper of
the ACR Incidental Findings Committee II on Vascular Findings. [HOSPITAL] 3931; [DATE].

## 2018-09-11 ENCOUNTER — Telehealth: Payer: Self-pay

## 2018-09-11 NOTE — Telephone Encounter (Signed)
Called and LM for pt to call back to discuss scheduling him for the F/U Flex Sig which was recommended after his colonoscopy in March 2018. Dr. Havery Moros: "He had a large piecemeal resection of rectal polyp, he needs a follow up flex sig in 3 months to reassess the site and ensure no residual polypoid tissue. (See notes from pathology 05-15-16).

## 2018-09-17 NOTE — Telephone Encounter (Signed)
LM for pt to call back to discuss F/U Flex Sig.

## 2018-10-01 NOTE — Telephone Encounter (Signed)
Called pt and Left a detailed message about what Dr. Havery Moros is advising and asked that he call us back to discuss.

## 2018-10-14 IMAGING — NM NM MYOCAR MULTI W/SPECT W/WALL MOTION & EF
2 series · 12 of 12 positions shown · non-contrast
Comparison: none

[Series 1: rest · 8.28mm/px · 6 of 64 frames shown]
[frame 6/64]
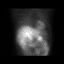
[frame 16/64]
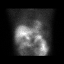
[frame 27/64]
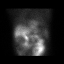
[frame 38/64]
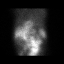
[frame 48/64]
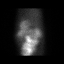
[frame 59/64]
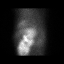

[Series 2: stress gated · 8.28mm/px · 6 of 64 frames shown]
[frame 6/64]
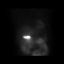
[frame 16/64]
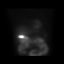
[frame 27/64]
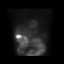
[frame 38/64]
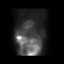
[frame 48/64]
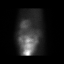
[frame 59/64]
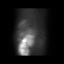

[12 of 12 positions shown; findings below may reference images not displayed]

Canned report from images found in remote index.

Refer to host system for actual result text.
